# Patient Record
Sex: Male | Born: 1960 | Race: White | Hispanic: No | Smoking: Current every day smoker
Health system: Southern US, Community
[De-identification: ages and names within clinical notes are randomized; demographics above are authoritative.]

## PROBLEM LIST (undated history)

## (undated) DIAGNOSIS — F329 Major depressive disorder, single episode, unspecified: Secondary | ICD-10-CM

## (undated) DIAGNOSIS — F32A Depression, unspecified: Secondary | ICD-10-CM

## (undated) DIAGNOSIS — I10 Essential (primary) hypertension: Secondary | ICD-10-CM

## (undated) DIAGNOSIS — I639 Cerebral infarction, unspecified: Secondary | ICD-10-CM

## (undated) HISTORY — DX: Depression, unspecified: F32.A

## (undated) HISTORY — DX: Essential (primary) hypertension: I10

## (undated) HISTORY — PX: KNEE SURGERY: SHX244

## (undated) HISTORY — DX: Cerebral infarction, unspecified: I63.9

## (undated) HISTORY — DX: Major depressive disorder, single episode, unspecified: F32.9

---

## 1998-09-09 ENCOUNTER — Emergency Department (HOSPITAL_COMMUNITY): Admission: EM | Admit: 1998-09-09 | Discharge: 1998-09-09 | Payer: Self-pay | Admitting: Emergency Medicine

## 2000-01-22 ENCOUNTER — Emergency Department (HOSPITAL_COMMUNITY): Admission: EM | Admit: 2000-01-22 | Discharge: 2000-01-22 | Payer: Self-pay | Admitting: Emergency Medicine

## 2001-05-17 ENCOUNTER — Emergency Department (HOSPITAL_COMMUNITY): Admission: EM | Admit: 2001-05-17 | Discharge: 2001-05-17 | Payer: Self-pay | Admitting: Emergency Medicine

## 2001-05-27 ENCOUNTER — Encounter: Payer: Self-pay | Admitting: Emergency Medicine

## 2001-05-27 ENCOUNTER — Emergency Department (HOSPITAL_COMMUNITY): Admission: EM | Admit: 2001-05-27 | Discharge: 2001-05-27 | Payer: Self-pay | Admitting: Emergency Medicine

## 2001-05-28 ENCOUNTER — Emergency Department (HOSPITAL_COMMUNITY): Admission: EM | Admit: 2001-05-28 | Discharge: 2001-05-28 | Payer: Self-pay | Admitting: Emergency Medicine

## 2001-05-28 ENCOUNTER — Encounter: Payer: Self-pay | Admitting: Emergency Medicine

## 2001-08-09 ENCOUNTER — Emergency Department (HOSPITAL_COMMUNITY): Admission: EM | Admit: 2001-08-09 | Discharge: 2001-08-09 | Payer: Self-pay | Admitting: Emergency Medicine

## 2001-08-09 ENCOUNTER — Encounter: Payer: Self-pay | Admitting: Emergency Medicine

## 2003-09-17 ENCOUNTER — Emergency Department (HOSPITAL_COMMUNITY): Admission: EM | Admit: 2003-09-17 | Discharge: 2003-09-18 | Payer: Self-pay | Admitting: Emergency Medicine

## 2004-02-21 DIAGNOSIS — I639 Cerebral infarction, unspecified: Secondary | ICD-10-CM

## 2004-02-21 HISTORY — DX: Cerebral infarction, unspecified: I63.9

## 2004-04-09 ENCOUNTER — Emergency Department (HOSPITAL_COMMUNITY): Admission: EM | Admit: 2004-04-09 | Discharge: 2004-04-09 | Payer: Self-pay | Admitting: Emergency Medicine

## 2004-12-17 ENCOUNTER — Emergency Department (HOSPITAL_COMMUNITY): Admission: EM | Admit: 2004-12-17 | Discharge: 2004-12-17 | Payer: Self-pay | Admitting: Emergency Medicine

## 2005-02-16 ENCOUNTER — Inpatient Hospital Stay (HOSPITAL_COMMUNITY): Admission: EM | Admit: 2005-02-16 | Discharge: 2005-03-11 | Payer: Self-pay | Admitting: Emergency Medicine

## 2005-02-16 ENCOUNTER — Ambulatory Visit: Payer: Self-pay | Admitting: Physical Medicine & Rehabilitation

## 2005-02-17 ENCOUNTER — Encounter: Payer: Self-pay | Admitting: Cardiology

## 2005-02-17 ENCOUNTER — Ambulatory Visit: Payer: Self-pay | Admitting: Cardiology

## 2005-02-20 ENCOUNTER — Ambulatory Visit: Payer: Self-pay | Admitting: Critical Care Medicine

## 2005-03-11 ENCOUNTER — Inpatient Hospital Stay (HOSPITAL_COMMUNITY)
Admission: AD | Admit: 2005-03-11 | Discharge: 2005-04-12 | Payer: Self-pay | Admitting: Physical Medicine & Rehabilitation

## 2005-03-11 ENCOUNTER — Ambulatory Visit: Payer: Self-pay | Admitting: Physical Medicine & Rehabilitation

## 2005-04-11 ENCOUNTER — Ambulatory Visit: Payer: Self-pay | Admitting: Physical Medicine & Rehabilitation

## 2005-04-14 ENCOUNTER — Ambulatory Visit: Payer: Self-pay | Admitting: *Deleted

## 2005-04-14 ENCOUNTER — Ambulatory Visit: Payer: Self-pay | Admitting: Nurse Practitioner

## 2005-04-25 ENCOUNTER — Emergency Department (HOSPITAL_COMMUNITY): Admission: EM | Admit: 2005-04-25 | Discharge: 2005-04-25 | Payer: Self-pay | Admitting: Emergency Medicine

## 2005-05-04 ENCOUNTER — Ambulatory Visit (HOSPITAL_COMMUNITY)
Admission: RE | Admit: 2005-05-04 | Discharge: 2005-05-04 | Payer: Self-pay | Admitting: Physical Medicine & Rehabilitation

## 2005-05-08 ENCOUNTER — Ambulatory Visit: Payer: Self-pay | Admitting: Physical Medicine & Rehabilitation

## 2005-05-08 ENCOUNTER — Encounter
Admission: RE | Admit: 2005-05-08 | Discharge: 2005-08-06 | Payer: Self-pay | Admitting: Physical Medicine & Rehabilitation

## 2005-05-20 ENCOUNTER — Emergency Department (HOSPITAL_COMMUNITY): Admission: EM | Admit: 2005-05-20 | Discharge: 2005-05-21 | Payer: Self-pay | Admitting: Emergency Medicine

## 2005-05-24 ENCOUNTER — Encounter
Admission: RE | Admit: 2005-05-24 | Discharge: 2005-08-09 | Payer: Self-pay | Admitting: Physical Medicine and Rehabilitation

## 2005-06-01 ENCOUNTER — Ambulatory Visit: Payer: Self-pay | Admitting: Psychology

## 2005-07-04 ENCOUNTER — Emergency Department (HOSPITAL_COMMUNITY): Admission: EM | Admit: 2005-07-04 | Discharge: 2005-07-05 | Payer: Self-pay | Admitting: Emergency Medicine

## 2005-08-07 ENCOUNTER — Ambulatory Visit: Payer: Self-pay | Admitting: Physical Medicine & Rehabilitation

## 2005-08-07 ENCOUNTER — Encounter
Admission: RE | Admit: 2005-08-07 | Discharge: 2005-11-05 | Payer: Self-pay | Admitting: Physical Medicine & Rehabilitation

## 2005-09-05 ENCOUNTER — Encounter: Admission: RE | Admit: 2005-09-05 | Discharge: 2005-09-05 | Payer: Self-pay | Admitting: Ophthalmology

## 2005-10-02 ENCOUNTER — Ambulatory Visit (HOSPITAL_BASED_OUTPATIENT_CLINIC_OR_DEPARTMENT_OTHER): Admission: RE | Admit: 2005-10-02 | Discharge: 2005-10-02 | Payer: Self-pay | Admitting: Ophthalmology

## 2005-11-08 ENCOUNTER — Ambulatory Visit (HOSPITAL_BASED_OUTPATIENT_CLINIC_OR_DEPARTMENT_OTHER): Admission: RE | Admit: 2005-11-08 | Discharge: 2005-11-08 | Payer: Self-pay | Admitting: Ophthalmology

## 2005-12-28 ENCOUNTER — Emergency Department (HOSPITAL_COMMUNITY): Admission: EM | Admit: 2005-12-28 | Discharge: 2005-12-28 | Payer: Self-pay | Admitting: Emergency Medicine

## 2006-03-06 ENCOUNTER — Emergency Department (HOSPITAL_COMMUNITY): Admission: EM | Admit: 2006-03-06 | Discharge: 2006-03-06 | Payer: Self-pay | Admitting: Emergency Medicine

## 2006-07-03 ENCOUNTER — Emergency Department (HOSPITAL_COMMUNITY): Admission: EM | Admit: 2006-07-03 | Discharge: 2006-07-03 | Payer: Self-pay | Admitting: Emergency Medicine

## 2007-04-11 IMAGING — CR DG CHEST 2V
2 series · 2 of 2 positions shown · non-contrast
Comparison: 02/17/2005

CLINICAL DATA: CVA

CHEST - 2 VIEW:

[view not recorded (1 of 2)]
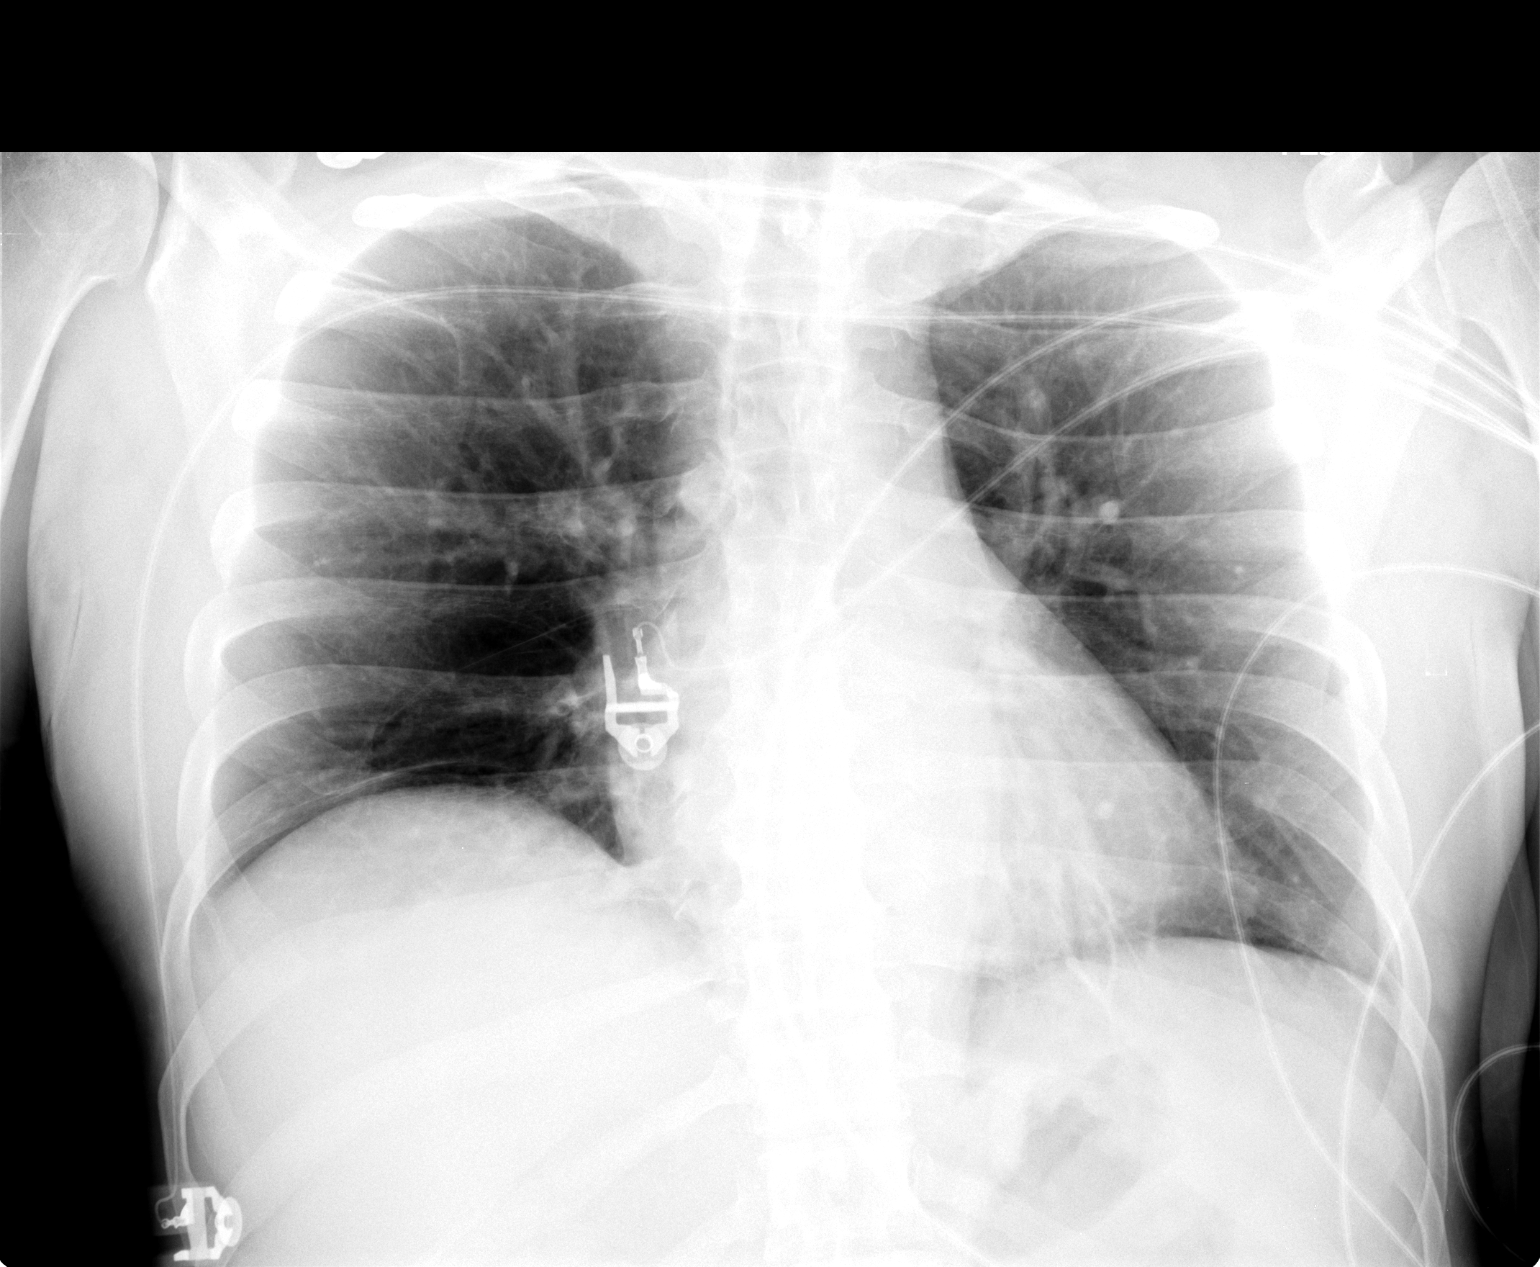

[view not recorded (2 of 2)]
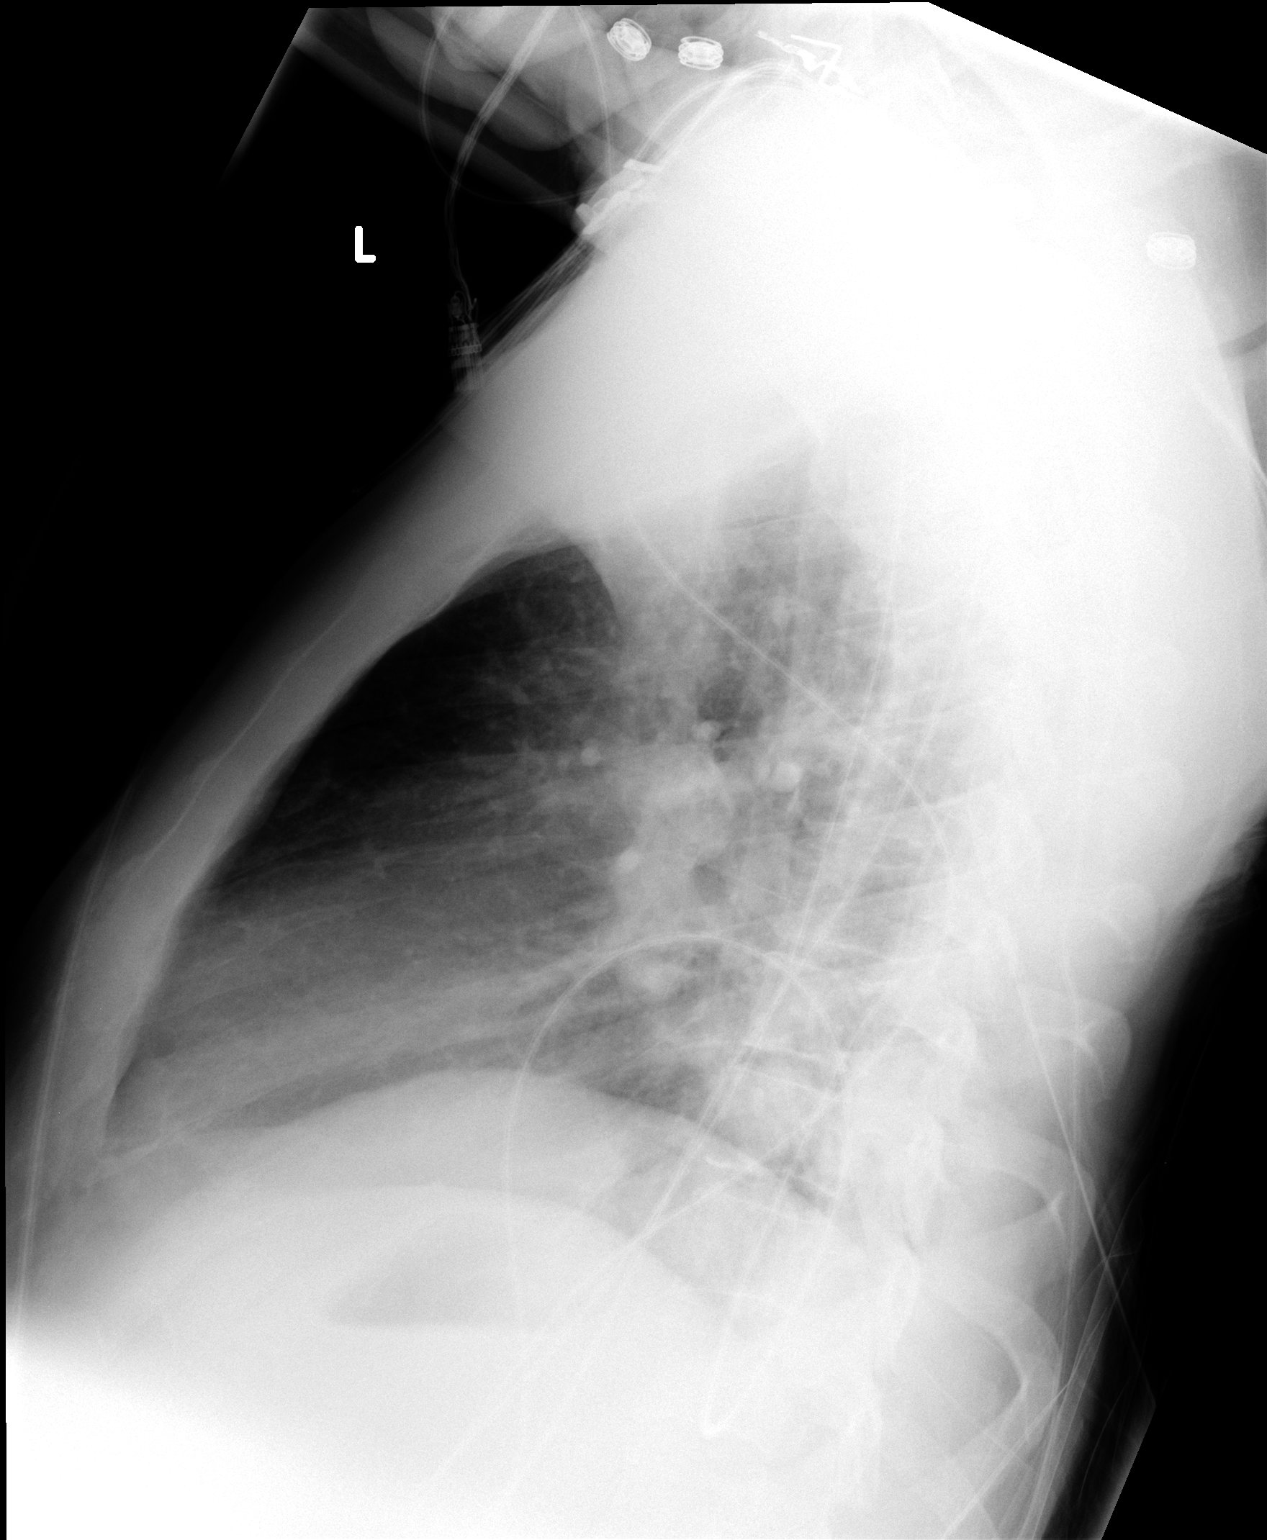

[2 of 2 positions shown; findings below may reference images not displayed]

FINDINGS: Low lung volumes. Minimal bibasilar atelectasis. No effusions. Heart
is normal size.
IMPRESSION: Low volumes. Minimal bibasilar atelectasis.

## 2007-04-12 IMAGING — CR DG CHEST 1V PORT
1 series · 1 of 1 positions shown · non-contrast
Comparison: none

CLINICAL DATA: Short of breath

Portable chest at 0647:
Comparison 02/17/2005. New airspace opacity at the right lung base. Left lung
remains clear. Heart size upper normal. No effusion.

[view not recorded]
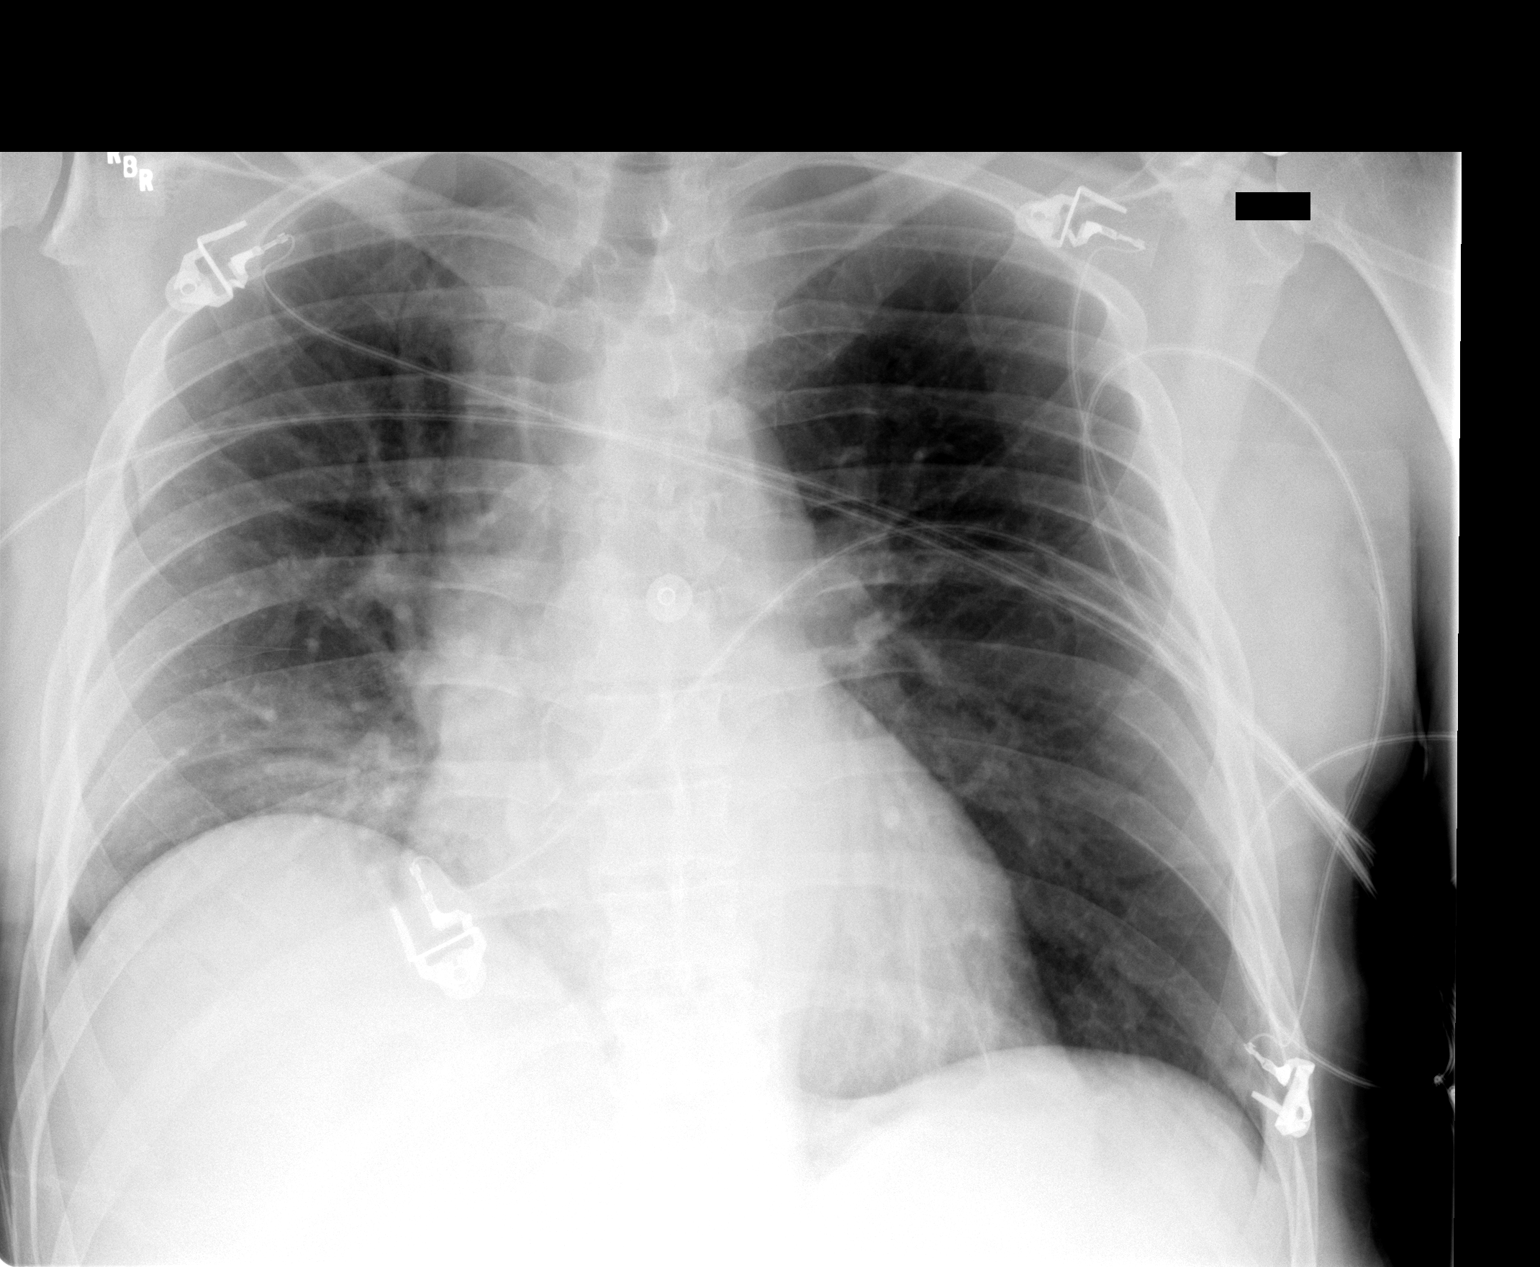

[1 of 1 positions shown; findings below may reference images not displayed]

IMPRESSION: 1. New right lower lobe focal infiltrate

## 2007-04-14 IMAGING — CT CT HEAD W/O CM
1 of 2 series · 13 of 30 positions shown, 17 images · IV contrast (agent unspecified)
Comparison: 02/19/05.

CLINICAL DATA: Patient is status post CVA.  New ventriculostomy shunt catheter. 
HEAD CT WITHOUT CONTRAST:
TECHNIQUE: Contiguous axial images were obtained from the base of the skull through the vertex according to standard protocol without contrast.

[Series 2: brain · axial · 0.47mm/px · z∈[+143,+273]mm · 13 of 32 slices shown, 17 images]
[im 3/32  brain]
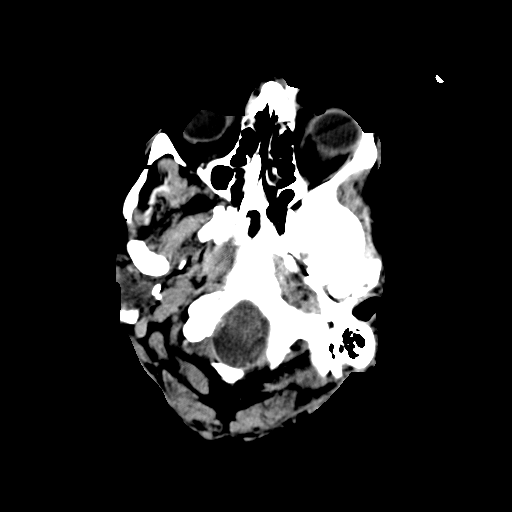
[im 3/32  bone]
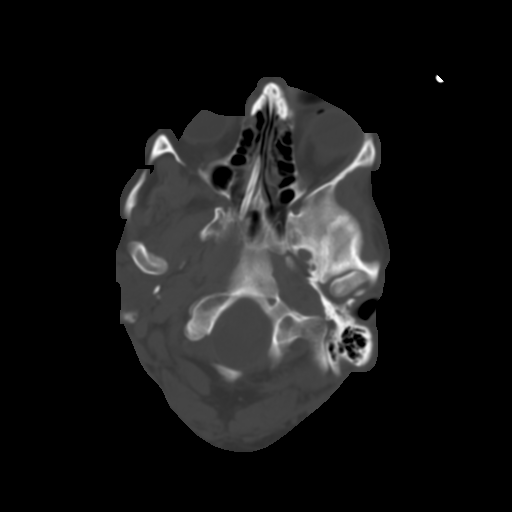
[im 5/32  brain]
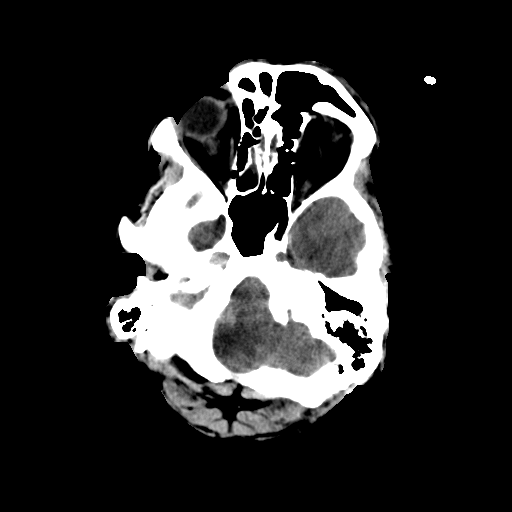
[im 7/32  brain]
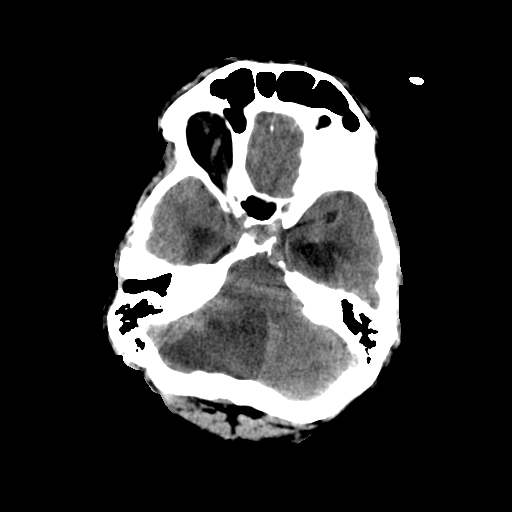
[im 9/32  brain]
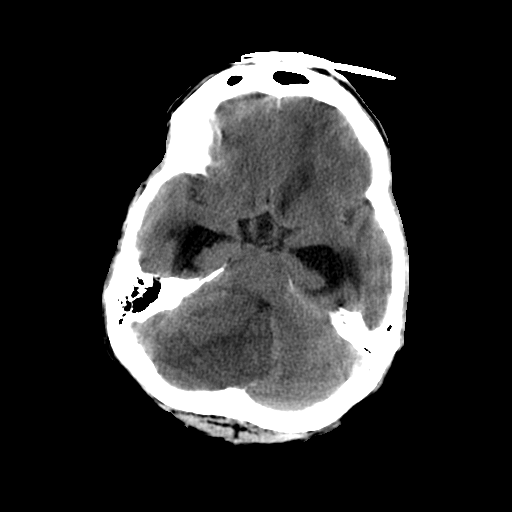
[im 12/32  brain]
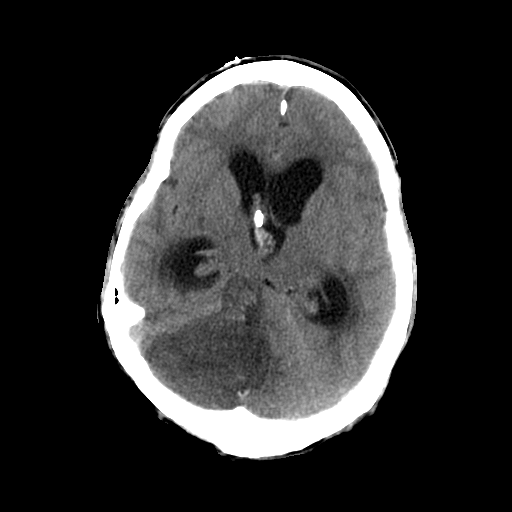
[im 12/32  bone]
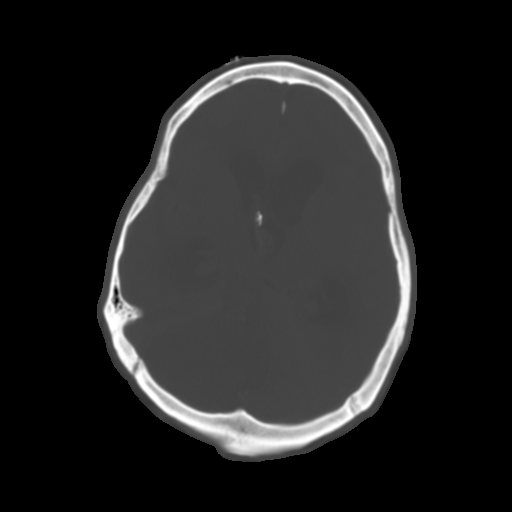
[im 14/32  brain]
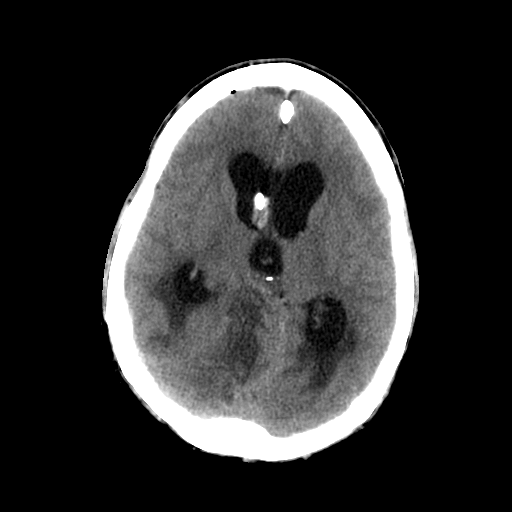
[im 16/32  brain]
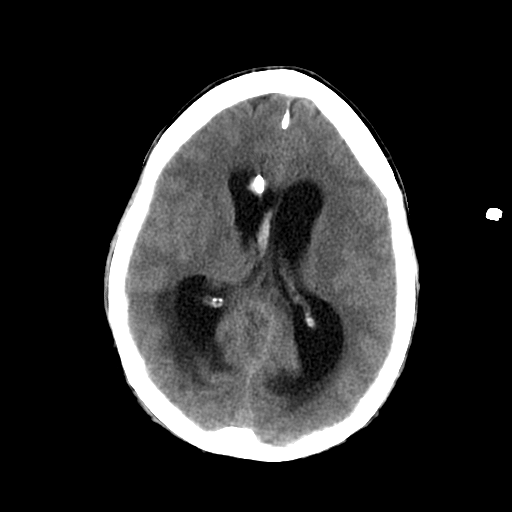
[im 18/32  brain]
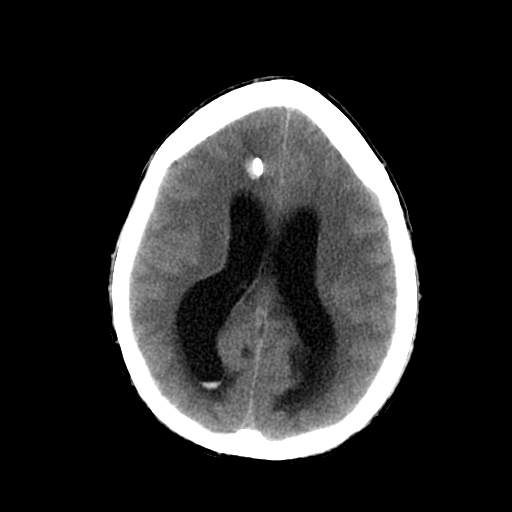
[im 20/32  brain]
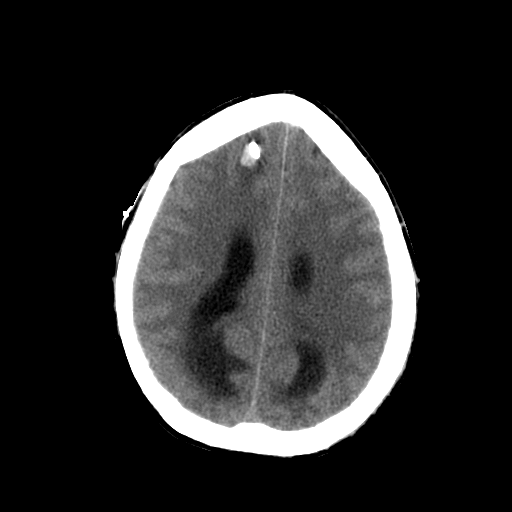
[im 20/32  bone]
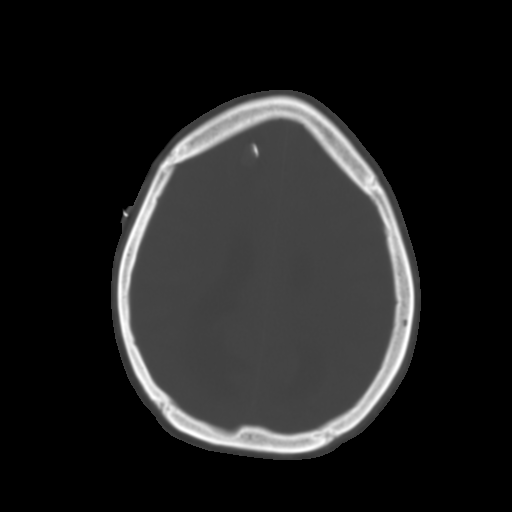
[im 23/32  brain]
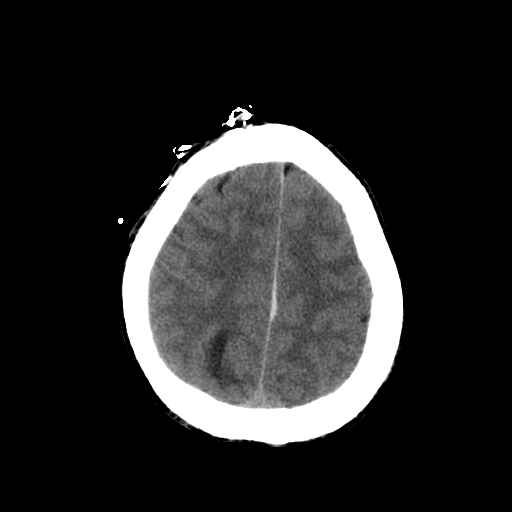
[im 25/32  brain]
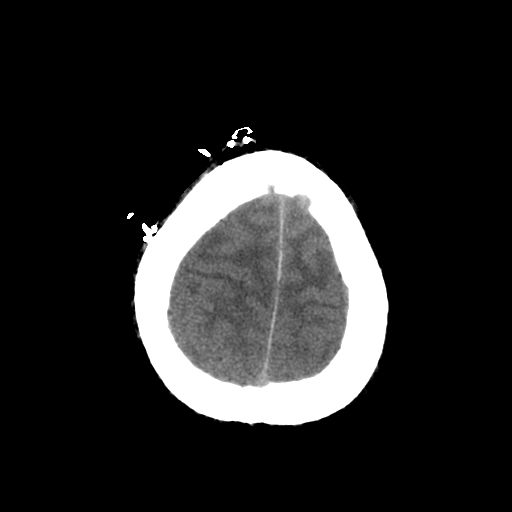
[im 27/32  brain]
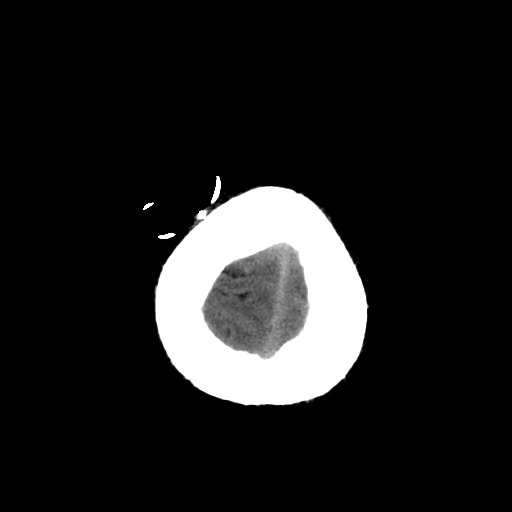
[im 29/32  brain]
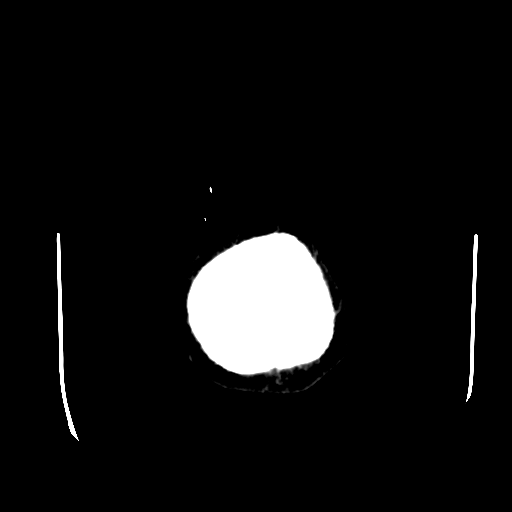
[im 29/32  bone]
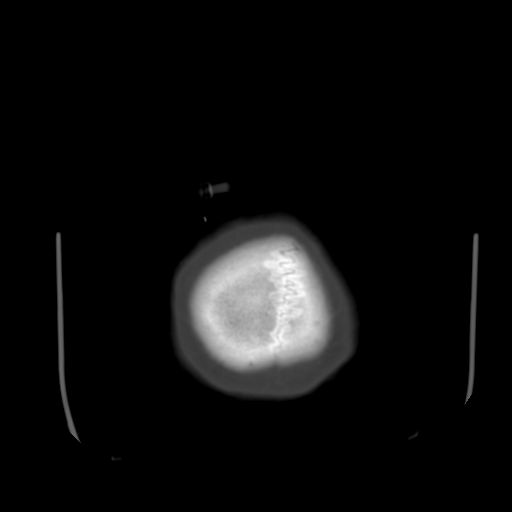

[13 of 30 positions shown; findings below may reference images not displayed]

FINDINGS: Again seen is a right frontal approach ventriculostomy shunt catheter.  There is now some high attenuation debris about the catheter tip with the tip of the shunt in the third ventricle.  There has been marked interval progression in hydrocephalus.  Small amount of blood is seen layering in the posterior horn of the right lateral ventricle.  Hypoattenuation in the right cerebellar hemisphere with mass effect compatible with stroke as described on prior studies is again seen.  Basilar cisterns remain open.
IMPRESSION: 1.  Marked interval increase in hydrocephalus since study yesterday.  Ventriculostomy shunt catheter now has high attenuation debris around it and is likely clogged.  Findings were called to Dr. Junia of [REDACTED] immediately on interpretation at [DATE] a.m. on 02/20/05. 
2.  Right cerebellar infarct again noted. 
3.  Small amount of intraventricular blood again seen.

## 2007-04-14 IMAGING — CR DG CHEST 1V PORT
1 series · 1 of 1 positions shown · non-contrast
Comparison: 02/19/05.

CLINICAL DATA: 44-year-old, with CVA.  Patient on ventilator.  
 PORTABLE CHEST:

[view not recorded]
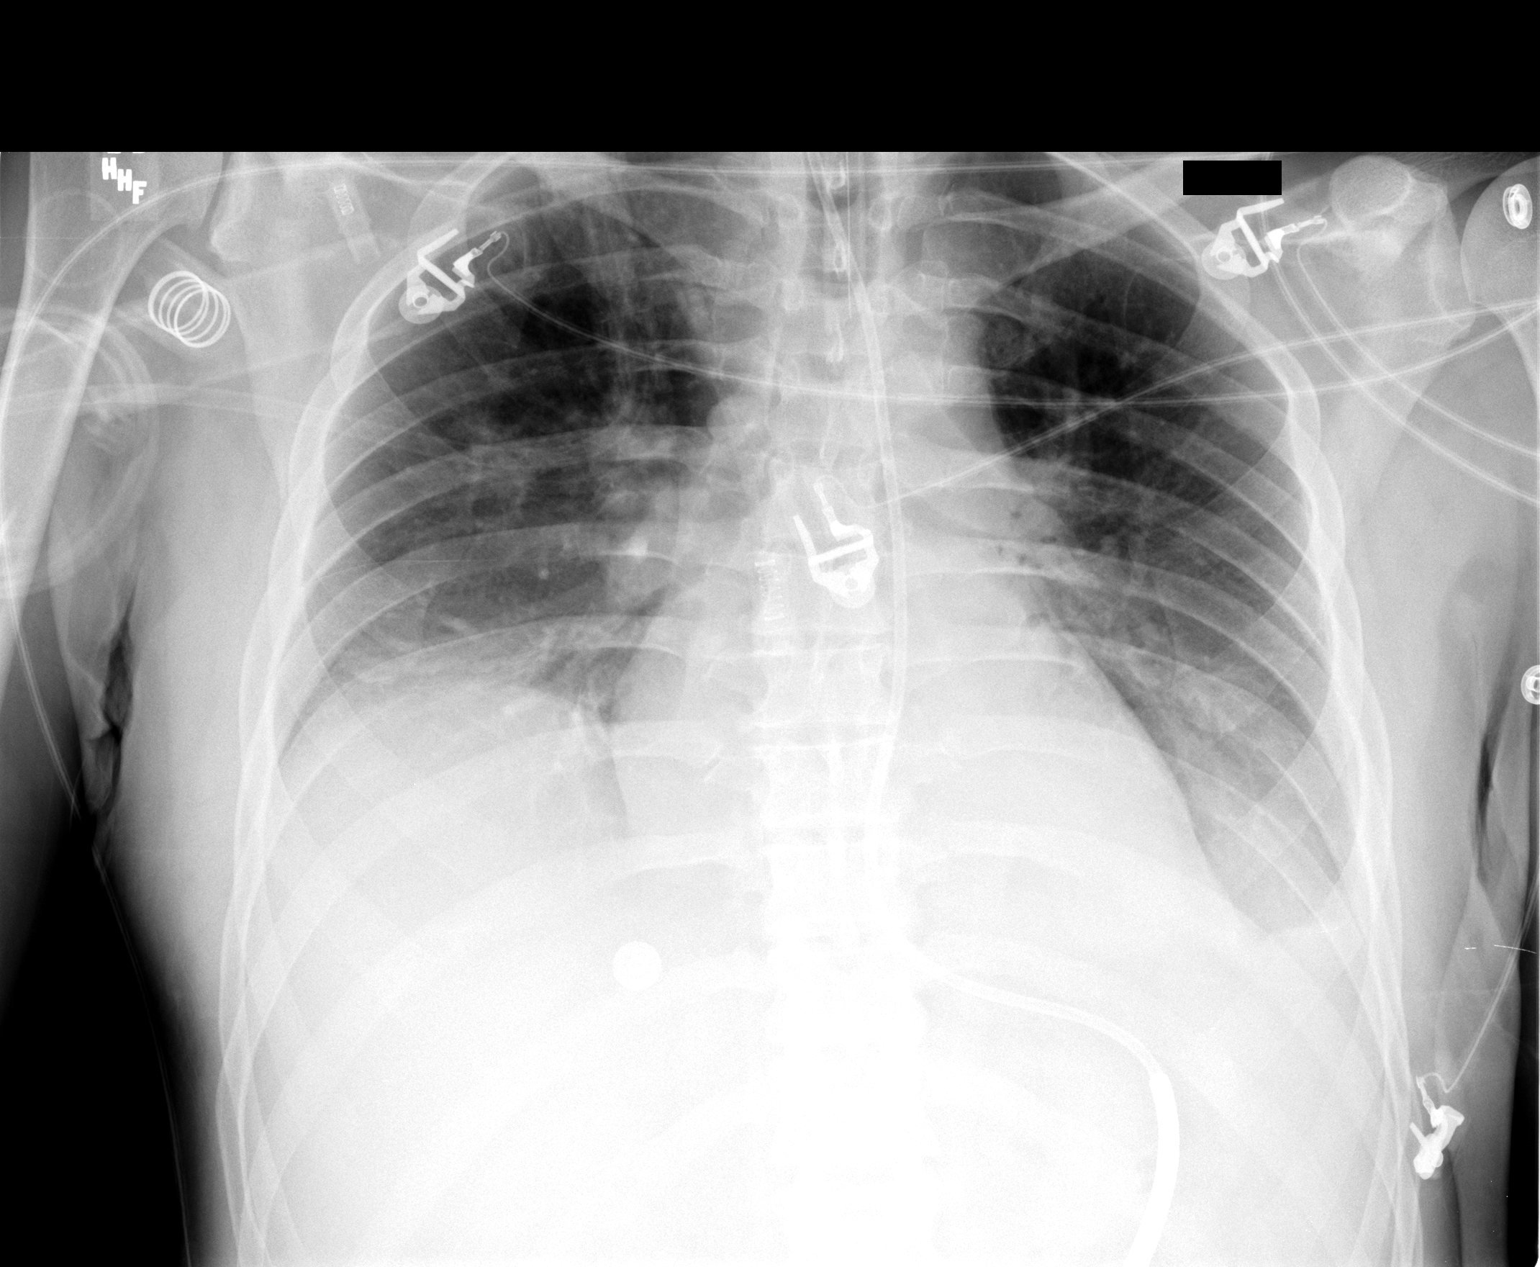

[1 of 1 positions shown; findings below may reference images not displayed]

FINDINGS: The endotracheal tube is in stable position at the midtrachea level.  There has been interval placement of a Panda tube which is in the stomach.  Heart and lungs have not significantly changed.  There are bilateral effusions and areas of bilateral atelectasis.
IMPRESSION: 1.  Interval placement of a Panda tube which is in the stomach. 
 2.  Persistent effusions, atelectasis, and possible mild edema.

## 2008-02-21 DIAGNOSIS — I10 Essential (primary) hypertension: Secondary | ICD-10-CM

## 2008-02-21 HISTORY — DX: Essential (primary) hypertension: I10

## 2010-01-18 ENCOUNTER — Emergency Department (HOSPITAL_COMMUNITY): Admission: EM | Admit: 2010-01-18 | Discharge: 2010-01-18 | Payer: Self-pay | Admitting: Emergency Medicine

## 2010-03-13 ENCOUNTER — Encounter: Payer: Self-pay | Admitting: Physical Medicine & Rehabilitation

## 2010-03-15 ENCOUNTER — Emergency Department (HOSPITAL_COMMUNITY)
Admission: EM | Admit: 2010-03-15 | Discharge: 2010-03-15 | Payer: Self-pay | Source: Home / Self Care | Admitting: Emergency Medicine

## 2010-07-01 ENCOUNTER — Other Ambulatory Visit: Payer: Self-pay | Admitting: Family Medicine

## 2010-07-01 ENCOUNTER — Ambulatory Visit
Admission: RE | Admit: 2010-07-01 | Discharge: 2010-07-01 | Disposition: A | Discharge status: Home / Self Care | Payer: Medicare Other | Source: Ambulatory Visit | Attending: Family Medicine | Admitting: Family Medicine

## 2010-07-01 DIAGNOSIS — R52 Pain, unspecified: Secondary | ICD-10-CM

## 2010-07-01 DIAGNOSIS — R609 Edema, unspecified: Secondary | ICD-10-CM

## 2010-07-08 NOTE — H&P (Signed)
NAME:  DANIELA, HERNAN NO.:  [id number]   MEDICAL RECORD NO.:  [id number]          PATIENT TYPE:  IPS   LOCATION:  4032                         FACILITY:  [facility]   PHYSICIAN:  Erick Colace, M.D.DATE OF BIRTH:  03-17-1960   DATE OF ADMISSION:  03/11/2005  DATE OF DISCHARGE:                                HISTORY & PHYSICAL   REASON FOR ADMISSION:  Quadriparesis, cranial nerve deficits, and cognitive  deficits related to large brainstem CVA.   HISTORY:  A 50 year old male with history of hepatitis C and IV drug abuse  admitted on February 16, 2005, with dysarthria, right-sided weakness, and  ataxia secondary to large brainstem stroke. Carotid Doppler showed a right  40% to 60% ICA stenosis. cerebral angiogram with temporary balloon occlusion  of abdominal aorta by Dr. Corliss Skains. The patient presented with leukocytosis  on admission with a white count of 24,000, elevated blood sugar to 241. On  February 18, 2005, noted to have decreased mental status. Head CT revealed  mass effect on the 4th ventricle and pons because of hydrocephalus requiring  IVC per Dr. Phoebe Perch. The patient developed a probable aspiration pneumonia,  had to be intubated, and started on IV Zosyn. He was trached and PEG placed  per Dr. Janee Morn on February 27, 2005, due to poor pulmonary toilet and severe  dysphagia. He has had fevers and grew out strep pneumonia, sensitive to  vancomycin and was changed to vancomycin and Zosyn on January 16th. Follow-  up CT of head on January 12th showed a new hemorrhagic inversion of the  right cerebellar infarct, stable right frontal hematoma. He has been  afebrile now since January 17th.   He has been downsized to #4 trach. He still has secretions, but he has good  cough. He has had problems with agitation requiring four point restraints  and mittens due to pulling at tube and PEG. He moves all four extremities.   REVIEW OF SYSTEMS:  Cannot be obtained  secondary to difficulty with  localizing his trach, but does follow commands.   PAST MEDICAL HISTORY:  As noted above with hepatitis C and polysubstance  abuse, history unknown.   SOCIAL HISTORY:  Lives with mother and independent prior to admission.  Smokes one and a half packs per day and uses heroin.   FUNCTIONAL HISTORY:  Independent prior to admission.   ALLERGIES:  None known.   Last white count of 6.6, hemoglobin 14.4, BUN 15, creatinine 0.6. Hepatitis  C and D positive.   PHYSICAL EXAMINATION:  GENERAL: In no acute distress. He has obvious cranial  nerve deficits, i.e., decreased right 3rd cranial nerve. He has no evidence  of ptosis. He attempts to phonate in whispers, but noncomprehensible. His  trach site is intact.  LUNGS: Occasional wheeze on the left, some upper airway sounds, upper airway  rhonchi.  HEART: Regular rate and rhythm.  ABDOMEN: Soft and nontender. PEG site is clean.  NEUROLOGIC: Memory and mood cannot be assessed. Sensation cannot be  assessed. Motor strength has 4/5 strength in bilateral deltoids by stress of  grip as well as hip flexion,  knee extension, ankle dorsal flexion.   IMPRESSION:  1.  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 Need to monitor for signs of neurologic      decline which would signal current hydrocephalus. Neurology recommends      aspirin in two weeks for further prophylaxis.  2.  Agitation. Will need Seroquel q.h.s. and add trazodone for sleep and      monitor sleep/wake cycle. Restraints as needed to protect against self-      discontinuation of trach and PEG.  3.  Dyslipidemia, on Zocor.  4.  Hypertension. Continue Catapres b.i.d.   The patient is an appropriate rehabilitation candidate. Should make good  progression with mobility. Prognosis for dysphagia is guarded. May wean  trach during rehab stay.   Estimated length of stay is two to three  weeks.      Erick Colace, M.D.  Electronically Signed     AEK/MEDQ  D:  03/11/2005  T:  03/12/2005  Job:  [id number]   cc:   Pramod P. Pearlean Brownie, MD  Fax: [phone]

## 2010-07-08 NOTE — Assessment & Plan Note (Signed)
HISTORY OF PRESENT ILLNESS:  Alan Patel is back regarding his right  posterior inferior cerebral artery stroke with hydrocephalus and hemorrhagic  transformation.  The patient had significant vertigo, nausea, ataxia and  dysphagia.  He was discharged from rehab on April 12, 2005, under the  care of his family.  The patient has done fairly well, receiving Home Health  therapy at this point.  He is having some pain, occasionally in the right  leg.  He is sleeping fairly well.  He has good appetite.  Although, he does  notice decrease in his taste.  He does report a depression and thoughts of  despair.  He felt that he was better off dead at times due to the fact that  he thinks he is a burden on his family.  The patient does report a double  vision.  He has worn his patch periodically.  We had him on a scopolamine  patch for his vertigo symptoms.  He had stopped this over concern that it  was causing his decreased taste, but he notes no changes in taste after  coming off this.  He did notice increase in his vertigo symptoms.   The patient seems to have been getting along fairly well with his family.  No worse in psychosocial issues prior to arrival.  His girlfriend is very  involved in his care.   REVIEW OF SYSTEMS:  The patient reports some numbness in the left shoulder.  Weight loss overall is noted, as well as, some night sweats, occasional  shortness of breath.  Other pertinent positive are listed above.   SOCIAL HISTORY:  Pertinent positive are listed above.  The patient has not  returned to smoking.  His girlfriend does smoke.   PHYSICAL EXAMINATION:  VITAL SIGNS:  Blood pressure 135/84, pulse 90,  respirations 16, saturating 96% on room air.  GENERAL APPEARANCE:  The patient is pleasant, no acute distress.  He is  alert and oriented x3.  Affect is bright and appropriate.  NEUROLOGICAL:  Gait is stable.  Coordination is decreased with significant  ataxia in the right upper  and lower extremities today.  Sensory function is  fair.  He does have some right sided oral numbness and palatal numbness.  The left shoulder is inconsistently numb.  Reflexes are increased on the  right side today.  The patient is dysarthric.  He has good cognition.  He  does have difficulty to abduction of the right eye as he is double when  using both eyes to view, particularly with scanning to the right.  Skin was  generally intact.  Mood, for the most part, was appropriate, although, he  became tearful when talking about his family and his own personal situation.  Dentition was poor with multiple dental caries noted in the mouth today.  HEART:  Regular rate and rhythm.  LUNGS:  Clear.  ABDOMEN:  Soft, nontender.   ASSESSMENT:  1.  Large right pica stroke with diplopia, right sided ataxia, dysphagia and      vertigo.  The patient has made nice strides overall.  2.  History of hypertension.  3.  Hepatitis C positive.   PLAN:  1.  Will initiate outpatient PT/OT speech as well as psychological      counseling.  2.  Will initiate a trial of Lexapro 10 mg q.h.s. for mood.  3.  Encouraged use of eye patch to help with vision as well as work on  strengthening the right extraocular musculature.  4.  Encouraged use of scopolamine patch to assist in vertigo symptoms.  5.  For the most part, the patient has done quite well.  I will see him back      in about two months time for follow up.  I asked him to call me back      sooner if need be.      Ranelle Oyster, M.D.  Electronically Signed     ZTS/MedQ  D:  05/10/2005 11:51:48  T:  05/11/2005 09:30:19  Job #:  [id number]

## 2010-07-08 NOTE — Op Note (Signed)
NAME:  Alan Patel, Alan Patel            ACCOUNT NO.:  [id number]   MEDICAL RECORD NO.:  [id number]          PATIENT TYPE:  AMB   LOCATION:  [facility]                         FACILITY:  [facility]   PHYSICIAN:  Tyrone Apple. Karleen Hampshire, M.D.DATE OF BIRTH:  [birth year]   DATE OF PROCEDURE:  11/08/2005  DATE OF DISCHARGE:                                 OPERATIVE REPORT   PREOPERATIVE DIAGNOSES:  1. Left fourth nerve paresis.  2. Diplopia.  3. Status post cerebrovascular accident.   PROCEDURE:  Left inferior oblique myectomy of 10 mm.   SURGEON:  Tyrone Apple. Karleen Hampshire, M.D.   ANESTHESIA:  General with laryngeal mask airway.   POSTOPERATIVE DIAGNOSES:  1. Status post left fourth nerve paresis.  2. Status post left inferior oblique myectomy.   INDICATION FOR PROCEDURE:  [name] is a 50 year old male with  diplopia secondary to a left fourth nerve paresis, which occurred 9 months  prior concurrently with a stroke and did not resolve post recovery from the  CVA.  The patient has residual diplopia secondary to a left hypertropia of 6  prism diopters.  This procedure is indicated to restore single binocular  vision and to restore alignment of the visual axis and ablate the diplopia.  The risks and benefits of the procedure were explained to the patient prior  to the procedure and informed consent was obtained.   DESCRIPTION OF TECHNIQUE:  The patient was taken into the operating room and  placed in a supine position.  The entire face was prepped and draped in the  usual sterile manner.  My attention was adressed to the left eye.  Forced  duction tests were performed and found to be negative.  The globe was then  held in the inferior temporal quadrant and the eye was elevated and  adducted.  An incision was then made in the inferior temporal fornix, taken  down to the posterior sub-Tenon's space and the left lateral rectus tendon  was then isolated on a Stevens hook, subsequently on a Green hook.  A  second  Green hook was then passed beneath the tendon.  This was used to hold the  globe in an elevated and adducted position.  Next the inferior oblique was  then isolated coursing from its origin in the anterior floor of the orbit to  its insertion in the posterior inferior temporal quadrant of the globe.  Next the its overlying muscle fascia and intermuscular septae were cut, the  tendon was then placed on two Green hooks and a 10  mm myectomy was performed.  Hemostasis was achieved with thermal cautery.  The conjunctiva was then repositioned.  There were no apparent  complications.  At the conclusion of the procedure TobraDex ointment was  instilled in the inferior fornices of the left eye.  There were no apparent  complications.      Casimiro Needle A. Karleen Hampshire, M.D.  Electronically Signed     MAS/MEDQ  D:  11/08/2005  T:  11/09/2005  Job:  [id number]

## 2010-07-08 NOTE — Discharge Summary (Signed)
NAME:  Alan Patel, Alan Patel   MEDICAL RECORD NO.:  [id number]          PATIENT TYPE:  IPS   LOCATION:  4025                         FACILITY:  [facility]   PHYSICIAN:  Ranelle Oyster, M.D.DATE OF BIRTH:  16-May-1960   DATE OF ADMISSION:  03/11/2005  DATE OF DISCHARGE:  04/12/2005                                 DISCHARGE SUMMARY   DISCHARGE DIAGNOSES:  1.  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  2.  Hypertension.  3.  Hepatitis C positive.  4.  Abnormal liver function tests, almost resolved.   HISTORY OF PRESENT ILLNESS:  Alan Patel is a 50 year old male with a  history of hepatitis C and IV drug use admitted to [facility] February 16, 2005, with dysarthria, right-sided weakness and ataxia  secondary to large brain stem stroke.  Full work-up done included check of  carotid Dopplers that showed right 40 to 60% ICA stenosis, cerebral  __________ balloon occlusion of abdominal aorta done by Dr. Corliss Skains.  Patient noted to have leukocytosis on admission with white count of 24,000  and elevated blood sugar 241.  On February 18, 2005, patient with decrease  in mental status secondary to mass effect fourth ventricle and pons with  hydrocephalus requiring IVC placement by Dr. Phoebe Perch as patient with  increased respiratory distress and chest x-ray with question aspiration  pneumonia.  He was intubated and started on IV Zosyn.  Patient was difficult  extubation with periods of apnea requiring trach and PEG placement on  February 27, 2005, by Dr. Janee Morn.  Patient has had issues with blood  cultures positive for Strep pneumoniae and has been treated with multiple  antibiotics.  Most recent CT of head of March 03, 2005, shows new  hemorrhagic conversion right cerebellar infarct, stable right frontal  hematoma, intraventricular hemorrhage and hydrocephalus.  Patient  currently  has had trach downsized to #4 cuff.  He does continue with agitation  requiring restraints as continues to pull on IV as well as PEG tube and  trach.  He is currently moving all four extremities, noted to have poor  balance and unsteady gait as well as decrease in left lower extremity motor  control.  He is noted to have impairment in ADLs and mobility as well as  cognitive deficits secondary to brain stem stroke.  Rehab consulted for  further therapies.   PAST MEDICAL HISTORY:  1.  Hepatitis C.  2.  Polysubstance abuse.   ALLERGIES:  NO KNOWN DRUG ALLERGIES.   FAMILY HISTORY:  Unknown.   SOCIAL HISTORY:  Patient lives with mother.  Was independent prior to  admission.  He does not use any alcohol.  Smokes one and a half pack per day  and positive heroin use.   HOSPITAL COURSE:  Alan Patel was admitted to rehab on March 11, 2005, for inpatient therapies to consist of PT and OT daily.  Past  admission, patient's blood pressures were monitored on b.i.d. basis and  Catapres.  He was maintained on trach  with a trach collar and continues tube  feeds initially.  Secondary to patient having tendency to pull out trach and  recent removal of PEG, restraints were used secondary to safety concerns  initially.  Patient's trach was changed to cuffless #4 and he was started on  Passy Muir valve trials and he was able to tolerate this without difficulty.   Labs were done past admission revealing hemoglobin 13.7, hematocrit 39.1,  white count 5.8, platelets 334.  Sodium 141, potassium 3.7, chloride 103,  CO2 30, BUN 12, creatinine 0.7, glucose 139.  LFTs show some elevation with  AST at 92, ALT 212, alkaline phosphatase 99.  Repeat LFTs were done and  initially showed elevation on March 15, 2005, with AST 121, ALT 272,  alkaline phosphatase 70, total bilirubin 0.5.  Patient's Zocor was placed on  hold and his LFTs have been monitored along.  Acute hepatitis C panel was   done and this showed anti-HCV positive.  Hepatitis B surface antigen,  hepatitis B core antigen were negative.  Anti-HIV IgM negative.  Last check  of LFTs of April 06, 2005, shows much improvement with AST 35, ALT 131,  alkaline phosphatase 113, total bilirubin 0.8.  Patient's Zocor was resumed  and he continues on this at time of discharge.  Patient's blood pressures  have been monitored on b.i.d. basis.  Catapres has slowly been tapered off  and blood pressure is noted to be on low side.  AT time of discharge,  patient's blood pressures ranging from high 90s to 120s systolic, 60s to 72Z  diastolic.  Heart rate control from 60s to 80s range.   Initially patient was noted to require frequent suctioning secondary to  increase in oral secretions.  Once [employer] valve trial was started, he  was started on continuous Passy Muir occlusion on March 17, 2005.  He was  able to tolerate this throughout the day and also through the night.  He was  decannulated on March 20, 2005, without difficulty.  As patient's mobility  improved, Foley discontinued on March 21, 2005, and patient was noted to  be voiding without signs of retention.  Follow-up swallow evaluation  initially of 130 showed patient to continue with severe dysphagia and he was  left NPO with bolus tube feeds.  Repeat MBS of March 29, 2005, showed  mild pharyngeal dysphagia with decrease in epiglottic deflection and  penetration obtained.  Regular thin liquid trials were initiated with speech  therapy only to practice use of chin tuck and aspiration precautions.  As  patient was able to do this consistently.  Repeat swallow done on April 10, 2005, and patient was advanced to a regular diet with thin liquids.  He  does continue to have delayed pharyngeal swallow reflex.  He is able to use  a chin tuck to eliminate penetration.  Patient continues to require using chin tuck for now.  He will have follow-up MBS on outpatient  basis to  further advance him to regular diet without compensatory techniques.   Patient's agitation is much improved during his stay.  He currently  continues with significant visual deficits, although he has made great  improvements throughout.  He continues to have mild nystagmus in left and  right eye.  He was noted to have increased dizziness with mobility  initially.  Scopolamine patch was added on April 06, 2005, and this has  helped greatly with his symptomatology.  Patient continues with increased  ataxia right  upper and right lower extremity currently.  In terms of self-  care, he is currently requiring setup assist for upper body care and minimal  assist for lower body care, supervision for toileting, minimal assist for  clothes manipulation.  Patient is at supervision for squat-pivot transfers,  close supervision to minimal assist sit to stand transfers.  He currently  requires minimal assist ambulating 150 feet with a rolling walker and  requires cueing to focus upon balance with gait as he has difficulty  controlling right lower extremity with occasional scissoring at times,  especially he requires minimal assist to navigate 12 stairs.  He requires  minimal assist for standing balance with upper extremity support.  Currently  patient's basic high level comprehension is intact.  He is able to follow  multistep commands without difficulty.  Basic high level expression is  intact.  Speech is intelligible at word level at 100%, 90% intelligible at  conversation level, mild imprecision of speech noted without signs of  apraxia.  Patient continues to require supervision minimal assist currently  secondary to balance safety issues.  SNF was recommended to patient and  family.  Initially, patient agreed, however, he declined this later.  Currently patient is discharged to home with girlfriend to provide  supervision to minimal assist past discharge.   DISCHARGE MEDICATIONS:  1.   Folic acid 1 mg per day.  2.  Prilosec OTC one per day.  3.  Zocor 20 mg nightly.  4.  Scopolamine patch 1.5 mcg per 72 hours change every three days.  5.  Colace 200 mg p.o. nightly.   ACTIVITY:  24-hour supervision and assistance.   DIET:  Regular.  Chin tuck with all liquids.   SPECIAL INSTRUCTIONS:  No alcohol, no drugs, no tobacco, no driving.   FOLLOW UP:  Patient to follow up with Dr. Ranelle Oyster, M.D., on May 20, 2005, at 11 a.m., to follow up with Dr. Pearlean Brownie in a couple of months, to  follow up with LMD at [facility] to set up for routine medical follow-up.      Greg Cutter, P.A.      Ranelle Oyster, M.D.  Electronically Signed    PP/MEDQ  D:  04/13/2005  T:  04/14/2005  Job:  [id number]   cc:   Pramod P. Pearlean Brownie, MD  Fax: [phone]

## 2010-07-08 NOTE — H&P (Signed)
NAME:  Alan Patel, Alan Patel            ACCOUNT NO.:  [id number]   MEDICAL RECORD NO.:  [id number]          PATIENT TYPE:  INP   LOCATION:  3107/07/18                         FACILITY:  [facility]   PHYSICIAN:  Mobolaji B. Bakare, M.D.DATE OF BIRTH:  1960/12/19   DATE OF ADMISSION:  02/16/2005  DATE OF DISCHARGE:                                HISTORY & PHYSICAL   PRIMARY CARE PHYSICIAN:  Unassigned.   CHIEF COMPLAINT:  Slurred speech about 6 p.m. yesterday.   HISTORY OF PRESENTING COMPLAINT:  Alan Patel is a 50 year old heroin IV  drug abuser.  He went to sleep about 3:30 yesterday afternoon, and he woke  up approximately 6 p.m.  He was noted by his mother to have slurred speech  and right facial droop.  He was unable to walk, he was staggering, and there  was no grip on his right side.  He was brought to the emergency department  by ambulance.  Code stroke was called and the patient arrived about 1917  hours.  The patient was not a candidate for tPA.  However, he underwent the  clinical trial involving temporary balloon occlusion of the abdominal aorta  of the abdomen.  He has been seen by Dr. Pearlean Brownie and procedure was done by Dr.  Bedelia Person.  He is currently in the intensive care unit, still has slurred  speech but denies any fine, localized weakness.  He noted headaches and  staggering  after he woke up.  He had some blurred vision in both eyes.   He was noted on evaluation in the emergency room to have leukocytosis of  24,500. The patient denies any fever or chills. He does have cough which is  productive of whitish sputum.  No pleuritic chest pain. He has no dysuria.  There is no diarrhea or abdominal pain.  He vomited once while in the ICU.  He was also noted to have hyperglycemia which is new for him. Blood glucose  was 241.  He has glucose in his urine.   The patient describes weight loss of about approximately 20 pounds in the  last two to three months and has been having increased  thirst, polyuria and  polydipsia. He denies any family history of diabetes mellitus.   REVIEW OF SYSTEMS:  He has headaches, nausea.  The rest of the review of  systems was in the HPI.   PAST MEDICAL HISTORY:  1.  Hepatitis C infection.  2.  IV drug abuse with heroin.   PAST SURGICAL HISTORY:  None.   MEDICATIONS:  None.   ALLERGIES:  No known drug allergies.   FAMILY HISTORY:  Mother is alive and well.  Father passed away from  myocardial infarction at age of 65.  His grandmother had colon cancer.   SOCIAL HISTORY:  He is a widower.  His wife passed away in 07/18/1994.  He has one  son who is 42 years old. The patient smokes cigarettes, one-and-a-half packs  per day.  He has been smoking for 30 years.  He does not drink alcohol. He  uses heroin.  No cocaine.  The patient is  currently unemployed.   PHYSICAL EXAMINATION:  VITAL SIGNS:  Temperature 98, blood pressure 121/55,  pulse 109, respiratory rate 20, oxygen saturation 98% on room air.  GENERAL:  On examination speech is slurred.  No in respiratory distress.  The patient is lying comfortably in bed.  HEENT: Normocephalic, atraumatic.  Extraocular muscle movement intact.  No  weakness noted now.  NECK:  No carotid bruits.  No elevated JVD.  No thyromegaly.  LUNGS:  Clear to auscultation.  CARDIOVASCULAR:  S1 and S2 regular.  No murmur, no gallop.  ABDOMEN:  Nondistended, s0oft, nontender.  Right groin has no hematoma  noted.  EXTREMITIES:  No pedal edema, no calf tenderness. Dorsalis pedis pulse 2+  bilaterally.  SKIN: The patient has multiple tattoos all over.  He has injection site  marked on his right cubital fossa.  There is no palpable abscess or  phlebitis noted.  CNS:  He has right facial weakness.  His tongue is deviated to the left.  He  has poor gag reflex on the right.  His soft palate appears to be weaker on  the right.  Cranial nerves II-VI appear intact.  Muscle power 5/5 in all  limbs tested.  I did not test  right lower limb secondary to restrictions.   INITIAL LABORATORY DATA:  Urinalysis insignificant.  Urine drug screen  positive for benzodiazepines and opiates.  Sodium 139, potassium 4.4,  chloride 101, bicarb 24, glucose 241, BUN 13, creatinine 1.4, total  bilirubin 0.8, alk phos 103, AST 29, ALT 31, total protein 7.2, albumin 4.2,  calcium 9.2.  PTT 27, PT 13.4, INR 1.  White cells 24.5, hemoglobin 16.9,  hematocrit 49.3, platelets 372, neutrophils 88, mild left shift with  occasional bands. Chest x-ray shows no acute abnormalities, minimal left  upper lobe atelectasis.  Head CT scan showed no intracranial abnormality.  MRI of the brain suggestive of large brain stem stroke.  EKG shows normal  sinus rhythm.  No acute ST changes.   ASSESSMENT/PLAN:  Alan Patel is a 50 year old Caucasian male presenting  with acute brain stem stroke.  He was not a candidate for tPA.  He underwent  temporary balloon occlusion as part of a clinical trial.  1.  Acute brain stem stroke.  Start Aspirin 300 mg p.r. and this can be      changed to p.o. when the patient's swallowing is assured.  Obtain      carotid Dopplers, 2-D echocardiogram, fasting lipid profile, hemoglobin      A1C, homocystine level, PT/OT evaluation, speech therapy for cognition      and speech rehab.  2.  New diagnosis of diabetes mellitus.  Most likely type 1 given the      patient's symptoms.  We initiate Lantus insulin 8 units subcutaneously      q.a.m. and place on hyperglycemic protocol as per ICU.  Diabetic      teaching and insulin teaching after hemoglobin A1C checked.  C peptide      insulin level and GAD antibodies.  3.  Leukocytosis of unclear etiology.  The patient has afebrile although he      has some cough.  Chest x-ray does not show any infiltrate.  There is a      question of atelectasis in the left upper lobe.  I will further evaluate      this with a lateral x-ray.  Obtain blood cultures. 4.  Substance abuse.   Heroin.  I noted the patient is  also positive for      benzodiazepines but there is no record of having been given      benzodiazepines in the emergency room.  The patient will be referred for      drug rehab counseling.  Will ask social work for that.  5.  History of hepatitis C infection.  He has normal liver enzymes.  We will      check hepatitis B and C serology.  The patient declines HIV testing.  6.  Tobacco abuse.  Will place on nicotine patch 21 mg daily and initiate      tobacco cessation counseling.  7.  Deep venous thrombosis prophylaxis.  Will give Lovenox 40 mg      subcutaneously daily.     Mobolaji B. Corky Downs, M.D.  Electronically Signed    MBB/MEDQ  D:  02/17/2005  T:  02/17/2005  Job:  [id number]

## 2010-07-08 NOTE — Consult Note (Signed)
NAME:  MANUS, WEEDMAN            ACCOUNT NO.:  [id number]   MEDICAL RECORD NO.:  [id number]          PATIENT TYPE:  INP   LOCATION:  3109                         FACILITY:  [facility]   PHYSICIAN:  Pramod P. Pearlean Brownie, MD    DATE OF BIRTH:  1960-03-30   DATE OF CONSULTATION:  DATE OF DISCHARGE:                                   CONSULTATION   REFERRING PHYSICIAN:  Lilyan Punt. Sydnee Levans, M.D.   PRIMARY DIAGNOSES:  Code stroke.   HISTORY OF PRESENT ILLNESS:  Mr. Cryer is a 50 year old Caucasian male  who woke up at about 6 a.m. today with slurred speech.  Denied further  weakness or clumsiness.  The patient states that he fell asleep at about 3  p.m.  He was fine before falling asleep.  This history was cooborated by his  mother who was at home with him.  The patient has continued to have slurred  speech, difficulty swallowing, and right-sided weakness and clumsiness after  arriving to the emergency room.  Code stroke was paged at 7:15 p.m.  The  patient arrived in the ER at 7:34.  I saw the patient at 7:55.  He had  clearly slurred speech, dysarthria, right-sided clumsiness, mild right-sided  weakness, and scored a 7 on the [facility] Stroke Scale.  He did not qualify for  IVT therapy secondary to time of onset being beyond three hours.  The  patient will be subsequently be admitted and evaluated for stroke.  The  patient denies any past medical history of hypertension, diabetes, ischemic  heart disease, hyperlipidemia.   PAST MEDICAL HISTORY:  1.  Chronic drug abuse with IV heroin.  He states that he did use IV heroin      earlier today.  2.  History of hepatitis C.   He does not take any medications and has not seen a medical doctor for  years.   HOME MEDICATIONS:  None.   MEDICATION ALLERGIES:  None.   PAST SURGICAL HISTORY:  None.   REVIEW OF SYSTEMS:  Not significant for headache today, but no prior history  of fevers, chills, cough, cold, or diarrhea.   SOCIAL HISTORY:  The  patient lives with his mother.  He abuses drugs and  smokes.   PHYSICAL EXAMINATION:  GENERAL:  Young, Caucasian male who appears to be in  distress.  VITAL SIGNS:  He is afebrile, pulse rate is tachycardic at 115 per minute  and sinus tachycardia, blood pressure 121/74, respiratory rate 18 per  minute.  EXTREMITIES:  Distal pulses well-felt.  SKIN:  The patient has multiple tattoos all over his body including his  legs.  I do not see any infected IV sites in the arm.  HEENT:  Head is nontraumatic.  ENT exam unremarkable.  NECK:  Supple without bruit.  CARDIAC:  Regular heart sounds.  LUNGS:  Clear to auscultation.  ABDOMEN:  Soft and nontender.  NEUROLOGIC:  The patient is drowsy, but can arouse easily and follow  commands appropriately.  He is oriented to time, place, and person.  He has  severe dysarthria.  His speech can be  understood with slight difficulty.  He  has weak cough and gag.  Movements are poor.  His tongue is midline,  movement is normal.  Pupils are 2 mm, vaguely reactive with full range with  slight restriction of eye in abduction.  There is no upper extremity drift,  however, there is mild right finger-to-nose dysmetria.  Grip strength is  symmetric.  He has mild weakness of the right leg with some drift.  There is  4+/5 strength of the left hip, foot and ankle dorsiflexors.  Coordination is  slow, but accurate on the right.  There is no touch or pinprick sensory  loss.  He is slightly ataxic and so his gait was not tested.   LABORATORY REVIEW:  CT scan of the head, non-contrast study, appears  unremarkable without any active abnormality.  Labs available so far include  white count elevated at 24.5.  Differential is not back yet.  PT, PTT, and  INR is normal.  Urinalysis and drug screen are pending at this time.   IMPRESSION:  A 50 year old gentleman with sudden onset of slurred speech,  dysphagia, right-sided weakness, and clumsiness secondary to large left   brain stem infarct in the setting of drug abuse.  He also has tachycardia  with elevated white count and may have underlying sepsis.   PLAN:  The patient has presented beyond the time for IV thrombolysis.  He  may potentially qualify for __________ stroke study, however, given his  history of drug abuse and possible ongoing sepsis he may not qualify for  either [facility] or the Sentis stroke studies.  The patient will be admitted to  the medical service and follow workup for his sepsis and drug abuse  problems.  IV hydration with normal saline.  Obtain an MRI scan of the brain  with MRI of the brain and neck.  Speech therapy consult for swallow  evaluation.  Physical therapy and occupational therapy consults as well as  rehabilitation consults.  Check carotid ultrasound, 2D echocardiogram,  fasting lipid profile, hemoglobin A1c, and homocystine.  DVT prophylaxis.           ______________________________  Sunny Schlein. Pearlean Brownie, MD     PPS/MEDQ  D:  02/16/2005  T:  02/17/2005  Job:  [id number]   cc:   Lilyan Punt. Sydnee Levans, M.D.  [address] [place]  [place] [address]

## 2010-07-08 NOTE — Op Note (Signed)
NAME:  Alan Patel, Alan Patel NO.:  [id number]   MEDICAL RECORD NO.:  [id number]          PATIENT TYPE:  INP   LOCATION:  3109                         FACILITY:  [facility]   PHYSICIAN:  Gabrielle Dare. Janee Morn, M.D.DATE OF BIRTH:  08/16/1960   DATE OF PROCEDURE:  02/28/2005  DATE OF DISCHARGE:                                 OPERATIVE REPORT   PREOPERATIVE DIAGNOSIS:  Respiratory failure, status post cerebrovascular  accident with need for enteral access.   POSTOPERATIVE DIAGNOSES:  Respiratory failure, status post cerebrovascular  accident with need for enteral access.   PROCEDURE:  Bedside tracheostomy and percutaneous endoscopic gastrostomy  tube placement.   SURGEON:  Violeta Gelinas.   ASSISTANT:  Earney Hamburg, [facility]   HISTORY OF PRESENT ILLNESS:  The patient is a 50 year old gentleman who was  admitted with cerebellar stroke. He has had a complicated course requiring  prolonged ventilatory dependence.  We are proceeding with bedside trache and  PEG today.   PROCEDURE IN DETAIL:  Informed consent was obtained from the patient's  mother by myself.  He remained monitored in his room in the neurosurgical  intensive care unit throughout the procedure.  The patient was identified.  We had turned our attention first to the PEG tube placement. The patient  received Norcuron first, then fentanyl and also remained on low dose  propofol infusion.  The esophagogastroduodenoscope was inserted into the  patient's mouth after placing a bite block down along the esophagus into the  stomach. No gross abnormalities in the esophagus were noted. Stomach was  insufflated. The scope was then passed through the duodenum down to the  junction of the second and third portion of the duodenum.  No ulcers or  other abnormalities were noted. The scope was drawn back and the stomach  insufflated fully with air. Palpation of the anterior abdominal wall  demonstrated a good location for poke.  The  area was prepped and draped in  sterile fashion. A small incision was made after injection with a percent  lidocaine with epinephrine.  The Angiocath was inserted under direct vision  into the stomach and the guidewire was placed through there.  The guidewire  was grasped with a snare via the endoscope and brought out through the mouth  by withdrawing the scope.  PEG tube was attached to the guidewire and  lubricated fully and pulled out through the anterior abdominal wall without  difficulty. The PEG tube was followed down into the stomach by the endoscope  for full visualization.  The flange was placed on the PEG tube to tighten it  down to approximately 3.5 on the tube.  This allowed it to just turn as  visualized from inside the stomach.  The scope was removed after evacuating  air from the stomach. The flange was attached to the PEG with a 3-0 silk  suture, double tied around the cuff of the flange.  A sterile dressing was  applied and the PEG tube was taped to the skin so as to avoid dislodgement.  The PEG will be placed to gravity. Attention was directed to the  tracheostomy.  A roll was placed behind the patient's shoulders.  His neck  was prepped and draped in sterile fashion. Transverse incision was made two  fingerbreadths cephalad to the sternal notch. Subcutaneous tissues were  dissected down revealing the strap muscles. These were divided along the  midline and tissues were cleared from the anterior portion of the trachea.  Site for entry was selected beneath the second tracheal ring under direct  palpation. Respiratory therapist then did withdrawal of the endotracheal  tube slowly to just above where we needed to enter.  At this time the  Angiocath was inserted from the [facility] kit, was inserted between the  second and third tracheal ring followed by the guidewire and the small blue  dilator. Next we used the [facility] dilator and then placed the 6 Shiley  trache tube  over 24 French blue dilator. This was hooked up to the  ventilator circuit and a carbon dioxide detector. Good color change was  noted and excellent volume returns were obtained. A sterile dressing was  applied. The trache was secured to the skin with four 3-0 nylon sutures, one  in each corner.  A Velcro trache strap was applied. The patient tolerated  the procedure well and saturations remained 100% throughout.  He remained  monitored in the neurosurgical intensive care unit. We will check a chest x-  ray.      Gabrielle Dare Janee Morn, M.D.  Electronically Signed     BET/MEDQ  D:  02/28/2005  T:  03/01/2005  Job:  [id number]   cc:   Danice Goltz, M.D. [facility]  [address] [place], [place] [address]   Pramod P. Pearlean Brownie, MD  Fax: [phone]

## 2010-07-08 NOTE — Consult Note (Signed)
NAME:  Alan Patel, Alan Patel NO.:  [id number]   MEDICAL RECORD NO.:  [id number]          PATIENT TYPE:  INP   LOCATION:  3109                         FACILITY:  [facility]   PHYSICIAN:  Gabrielle Dare. Janee Morn, M.D.DATE OF BIRTH:  May 11, 1960   DATE OF CONSULTATION:  02/24/2005  DATE OF DISCHARGE:                                   CONSULTATION   REASON FOR CONSULTATION:  Request for trach and PEG.   HISTORY OF PRESENT ILLNESS:  Mr. Calo is a 50 year old male patient  admitted on 02/16/05, after 3-6 hours estimated onset into an acute CVA.  He  is also status post aortogram with balloon occlusion of AAA.  This was  started the Sentis trial.  He subsequently suffered a cerebellar CVA with  hydrocephalus and aspiration pneumonia and ventilatory dependent respiratory  failure due to this.  He also has as history of tobacco abuse and  polysubstance abuse that includes heroine and benzodiazepines.  He is  currently intubated and CCM has been unable to wean and extubate the patient  secondary to altered mental status.  He also has a parenteral tube in place  for medications and nutrition.  The patient's recovery phase is expected to  be prolonged and a request has been made for placement of PEG and trach  tubes.   REVIEW OF SYSTEMS:  Unobtainable from the patient.  The patient step mom is  in the room.  States that patient was relatively healthy and ambulatory  prior to the stroke except for tobacco, alcohol and drug abuse which has  been ongoing for years.  To her recollection he has not had any difficulty  swallowing or other symptoms consistent with esophageal stricture.   SOCIAL HISTORY:  He is a widower.  His second wife committed suicide in  1996.  He has one son who is 51 and lives with the patient.  He does smoke.  He does drink alcohol and here benzodiazepines were found on his urine and  drug screen on admission.  His mother is next of kin, Alfonzo Feller [phone].   FAMILY MEDICAL HISTORY:  Obtained from the chart.  Father died at age 38 of  an MI.  His mother is healthy.   PAST MEDICAL HISTORY:  1.  Polysubstance abuse.  2.  History of tobacco abuse.  3.  History of hepatitis C.   PAST SURGICAL HISTORY:  Unknown.   ALLERGIES:  No known drug allergies.   MEDICATIONS:  None prior to admission.  In the hospital the patient is  currently on:  1.  Nicotine patch.  2.  Insulin per hyperglycemia protocol.  3.  DDAVP which is currently on hold.  4.  Peridex.  5.  Protonix.  6.  Zocor.  7.  Folic acid.  8.  Colace.  9.  Tylenol p.r.n.  10. Lantus insulin twice daily.  11. Zosyn.  12. Jevity tube feedings.  13. Thorazine IV p.r.n.  14. Zofran p.r.n.  15. Ativan p.r.n.   PHYSICAL EXAMINATION:  GENERAL:  Male patient currently intubated,  lethargic, requires tactile stimulation and loud voices to arouse.  VITAL  SIGNS:  Temperature 99.9 rectal, BP 112/68, pulse 80 and regular.  Respirations 13.  The patient opens eyes to stimulation as stated above.  He  does follow easy simple commands.  He has spontaneous purposeful movement of  extremities x 4.  His lower extremity strength is 3/5.  His upper extremity  strength is 1/5.  HEENT:  Head is normocephalic.  Sclerae not injected.  NECK:  Supple without adenopathy.  CHEST:  Bilateral lungs now coarse but clear to auscultation anteriorly.  The patient is intubated on the ventilator with 40% FIO2 and PEEP of 5.  Cardiac heart sounds are S1, S2.  No rubs, murmurs, thrills or gallops.  No  JVD.  He is maintaining sinus rhythm, sinus tach on telemetry.  ABDOMEN:  Soft, nontender, nondistended without hepatosplenomegaly, masses  or bruits.  He has obtained a tube feeding infusing at present.  EXTREMITIES:  Thin in appearance, but not atrophied.  No edema, cyanosis or  clubbing.  Pulses are palpable, 2+ bilaterally.  SKIN:  Intact.  No obvious abrasions or decubitus.  He has multiple tattoos  on his  extremities including 2 on the dorsum of each foot.   LABORATORIES:  Cultures from February 23, 2005 are pending.  Sodium 144,  potassium 3.5, BUN 23, creatinine 1.1, glucose 254.  WBC are 13,700.  Hemoglobin is 17.1.  Platelets are 422,000.  Homocystine level is 24.4.  BAL  sputum obtained on December 30 was negative.   DIAGNOSTICS:  EKG from December 28 shows sinus tachycardia ventricular rate  of 108, QTC of 430 milliseconds, no ischemic changes.  Portable chest x-ray  on 02/24/05, shows no acute lung disease.  EP tube and PICC line are in  place.   IMPRESSION:  1.  Status post cerebellar CVA with hydrocephalus and continued altered      mental status.  2.  Aspiration pneumonia with ventilator dependent respiratory failure      complicated by altered mental status and ability to wean.  3.  Secondary hyperglycemia.  Medication related secondary to Decadron.  4.  Polysubstance abuse including heroine and benzodiazepines and alcohol.  5.  Hepatitis C.  Positive with normal LFT.  6.  Tobacco abuse.  7.  Dyslipidemia.   PLAN:  I have discussed with patient with Dr. Janee Morn.  He has also  examined the patient.  We will proceed with trach and PEG early next week  depending OR availability.  Dr. Janee Morn will also need to discuss  procedure, risks  and benefits with the patient's mother to obtain consent.  Mother is not  available in the room at this time.  The patient's step mother will be  speaking with the mom initially since the patient's mom is easily anxious  over any new changes in the patient's plan of care.      Allison L. Rennis Harding, N.P.      Gabrielle Dare Janee Morn, M.D.  Electronically Signed    ALE/MEDQ  D:  02/24/2005  T:  02/24/2005  Job:  [id number]

## 2010-07-08 NOTE — Discharge Summary (Signed)
NAME:  Alan Patel, Alan Patel NO.:  [id number]   MEDICAL RECORD NO.:  [id number]          PATIENT TYPE:  INP   LOCATION:  3032                         FACILITY:  [facility]   PHYSICIAN:  Elliot Cousin, M.D.    DATE OF BIRTH:  [birth year]   DATE OF ADMISSION:  02/16/2005  DATE OF DISCHARGE:  03/11/2005                                 DISCHARGE SUMMARY   ADDENDUM:  This is an addendum discharge summary.  Please see the previous  interim discharge summary dictated by Dr. Lavera Guise, on January 14 ,2007.   HOSPITAL COURSE:  Please see the previous discharge summary dictated by Dr.  Lavera Guise.  During the hospitalization from January14 through January20,2007, the  patient improved progressively.  The only major issues were strep pneumonia  and tracheitis.  The patient had been started on Rocephin 1 g daily for  strep pneumonia earlier in the hospital course.  The patient continued to  develop low grade fevers on Rocephin.  A repeat chest x-ray on  January15,2007, revealed significant atelectasis and consolidation at the  left lower base.  In addition, the patient continued to have a moderate  amount of secretions from the tracheostomy.  Given the ongoing low grade  fevers and increased secretions, antibiotic therapy was broadened with  vancomycin and Zosyn.  Following the initiation of vancomycin and Zosyn, the  patient's fevers resolved, and the amount of tracheal secretions subsided.   The patient was reevaluated by the rehabilitation team at [facility].  He was felt to be an appropriate candidate for rehab.  He was  therefore discharged and transferred to the rehab unit under the care of Dr.  Hermelinda Medicus on January20,2007.  The patient had completed a 5-day course of  vancomycin and Zosyn prior to hospital discharge.  The recommendation was to  continue vancomycin and Zosyn for a total of 7 days.  Prior to hospital  discharge, the patient's tracheostomy was downsized to number 4.   DISCHARGE MEDICATIONS:  1.  Clonidine 0.1 mg b.i.d.  2.  Jevity tube feeding 1.2, 75 mL an hour.  3.  Seroquel 25 mg per tube b.i.d.  4.  Potassium chloride 40 mEq via PEG tube daily.  5.  Xopenex 0.63 nebulizer q.8 h.  6.  Lantus 30 units subcu q.12 h.  7.  Vancomycin IV per pharmacy.  8.  Zosyn 3.375 mg IV q.6 h.      Elliot Cousin, M.D.  Electronically Signed     DF/MEDQ  D:  05/26/2005  T:  05/27/2005  Job:  [id number]

## 2010-07-08 NOTE — Discharge Summary (Signed)
NAME:  OLVIN, ROHR NO.:  [id number]   MEDICAL RECORD NO.:  [id number]          PATIENT TYPE:  INP   LOCATION:  3103                         FACILITY:  [facility]   PHYSICIAN:  Lonia Blood, M.D.       DATE OF BIRTH:  11-13-1960   DATE OF ADMISSION:  02/16/2005  DATE OF DISCHARGE:                                 DISCHARGE SUMMARY   INTERIM DISCHARGE SUMMARY:  The patient was unassigned.   PROBLEM LIST AT TIME OF DICTATION:  1.  Acute cerebellar stroke with cerebral edema, mass effect and      hydrocephalus, status post right ventriculostomy by Dr. Phoebe Perch.  2.  Right frontal lobe hematoma.  3.  Hypertension.  4.  Status post ventilator dependent respiratory failure secondary to      intracranial event. Currently on T-collar, status post tracheostomy.  5.  Chronic hepatitis C.  6.  Chronic intravenous heroin abuse.  7.  Diabetes mellitus.  8/ Status post gastrostomy tube placement by Dr. Janee Morn from surgery.   PROCEDURES DURING THIS ADMISSION:  1.  The patient underwent an intra-aortic balloon pump placement on February 16, 2005 as part of a stroke trial. The balloon pump was removed on      February 17, 2005.  2.  Carotid Doppler ultrasound on February 17, 2005 showing a right internal      carotid artery stenosis of 40-60%.  3.  Ventriculostomy tube placement by Dr. Phoebe Perch from neurosurgery on      February 18, 2005.  4.  Oral tracheal intubation on February 18, 2005.  5.  The patient underwent PICC line placement in the right upper extremity      on February 21, 2005.  6.  Tracheostomy and gastrostomy tube placement on February 27, 2005 by Dr.      Janee Morn.   CONSULTATIONS:  1.  [facility] consulted throughout this      admission. Dr. Phoebe Perch from neurosurgery.  2.  [facility] consulted and assumed care from February 25, 2005 until March 03, 2005.  3.  Dr. Janee Morn from [facility] consulted  and performed a      tracheostomy and PEG tube placement.   For admission history and physical please refer to the dictated H&P done by  Dr. Corky Downs on February 16, 2005.   HOSPITAL COURSE:  PROBLEM #1. Altered mental status secondary to acute  cerebellar stroke. The patient was admitted to [facility] to the  intensive care unit. He was seen immediately by Dr. Pearlean Brownie from neurology for  a consultation. The patient was not a candidate for intravenous tPA and  because of that he was enrolled in a trial using an intra-aortic balloon  pump. The patient had initially made some good progression with his stroke.  On hospital day #2 his mental status altered further. An emergent MUGA scan  of his head showed the development of hydrocephalus. He was seen in  consultation by Dr. Phoebe Perch from neurosurgery who emergently placed a  ventriculostomy catheter. Because  of his increased intracranial pressure,  Mr. Eichorn respiratory status deteriorated too and he had to be  intubated on February 18, 2005. At that time the pulmonary critical care  team was called for consultation and they guided the care while the patient  was on the ventilator. The patient was stable throughout his stay in the ICU  on the ventilator. Because of his persistent altered mental status from the  hydrocephalus, he had to have a tracheostomy tube placed and was weaned to a  T-collar.  On March 03, 2005 the [facility] Team reassumed care of this  patient and he will continue with chronic weakening on T-collar and further  efforts will be made for physical therapy, occupational therapy, and  placement of Mr. Bingman.  PROBLEM #2. Hyperlipidemia. Mr. Hoogendoorn was started on a Statin here in the  hospital.  PROBLEM #3. Chronic hepatitis C. It does not seem to be active at this  point. Mr. Aydt's liver function tests are within normal limits. His  coagulation tests are within normal limits.  PROBLEM #4. Right  frontal hematoma. With placement of a ventriculostomy  drain Mr. Lares developed a right frontal hematoma. This is stable at the  time of my dictation.  This will be followed up by getting a MUGA scan and serial neurological  checks and neurology is also consulting on this patient.  PROBLEM #5. Febrile illness. Starting on February 22, 2005 Mr. Choyce  started developing fever. He had a culture of his blood, culture of his  bowel and urine. He grew Streptococcus pneumoniae in his bronchial lavage.  He was placed on intravenous Rocephin. At the time of my dictation, March 05, 2005, he still has low-grade temperatures on Rocephin. This time we are  pursing a new set of urine cultures. His low-grade temperature at this time  could also be due to his frontal hematoma that is absorbing.   This interim discharge summary was dictated by Dr. Lonia Blood.      Lonia Blood, M.D.  Electronically Signed     SL/MEDQ  D:  03/05/2005  T:  03/05/2005  Job:  [id number]   cc:   Clydene Fake, M.D.  Fax: [phone]

## 2010-07-08 NOTE — Assessment & Plan Note (Signed)
Alan Patel is back regarding his right PICA stroke with hydrocephalus with  resultant diplopia and right-sided ataxia and vertigo. Vertigo is improving  a bit but he is still having difficulties with his ataxia and diplopia.  His  ophthalmologist is planning surgery for realignment of his extraocular  muscles. He stopped Lexapro as he had side effects including swelling of his  tongue and some anxiety.  He complains of pain in the right shoulder and  right inguinal region today which is most noticeable with activity.  He  rates his pain as a 7 out of 10.  He states that pain interferes with his  general activities, relation to others, enjoyment of life on a moderate to  severe level.  His mother whom he lives with reports his behavior as awful  and severely agitated and depressed at times.  The patient uses a weighted  walker at home.  For longer distances, especially outside the home, he uses  a wheelchair.   On review of systems patient denies any new neurological, psychiatric,  constitutional, GU, GI, respiratory complaints other than those mentioned  above.  He is still involved in physical therapy.   SOCIAL HISTORY:  Pertinent positives listed above. The patient is currently  living with his mother.   PHYSICAL EXAMINATION:  VITAL SIGNS:  Blood pressure is 104/70, pulse is 74,  respiratory rate 16, he is saturating 96% in room air.   The patient is pleasant, in no acute distress. He is alert and oriented x3.  Affect appropriate. He smells of tobacco. Coordination was decreased on the  right side with significant ataxia noted in both the arm and leg. He is able  to transfer, sits to stand independently. He needs contact supervision for  gait.  He is not wearing appropriate shoe wear to walk today really.  Sensation is generally intact on the right side. He has difficulties with  movements of finger-to-nose and heel-to-shin today on the right side.  Cognitively he seemed to be within  normal limits today. Cranial nerve exam  is remarkable for diplopia and weakness of the right eye. Dentition was  poor.  Heart was regular rate and rhythm. Lungs were clear. Abdomen soft,  nontender. Right shoulder is painful with rotator cuff maneuvers.  Right  inguinal region was minimally tender with palpation today.   ASSESSMENT:  1.  Large right PICA stroke with diplopia, ataxia, dysphagia, and vertigo.      The patient continues to make gradual gains.  2.  Hypertension.  3.  Hepatitis C positive.  4.  Right rotator cuff syndrome.   PLAN:  1.  Continue outpatient PT and OT.  2.  Will stop Lexapro and begin a trial of Cymbalta 30 mg daily for the      first week, then up to 60 mg daily thereafter.  3.  Continue eye management per ophthalmology.  4.  Following informed consent we incidentally injected the right shoulder      via posterior approach with 40 mg of Kenalog and 3 cc 1% lidocaine.  5.  Encourage repetitive use and retraining of the right arm and leg to help      improve his stamina and coordination.      He still has time to improve physically although gains will be more      modest over the next several month's time.  6.  I will see the patient back in about 2 month's time here in the office.  Ranelle Oyster, M.D.  Electronically Signed     ZTS/MedQ  D:  08/08/2005 16:36:59  T:  08/08/2005 22:19:10  Job #:  [id number]

## 2010-07-08 NOTE — Op Note (Signed)
NAME:  Alan Patel, Alan Patel NO.:  [id number]   MEDICAL RECORD NO.:  [id number]          PATIENT TYPE:  INP   LOCATION:  3109                         FACILITY:  [facility]   PHYSICIAN:  Judie Petit, M.D. DATE OF BIRTH:  [birth year]   DATE OF PROCEDURE:  DATE OF DISCHARGE:                                 OPERATIVE REPORT   The anesthesia team was called to intubate this 50 year old male in  respiratory distress that pulled out his endotracheal tube tonight.  Vital  signs are stable on our arrival.  The patient was being masked by  respiratory therapy.  Rapid sequence induction with cricoid pressure.  200  mg of sodium pentothal IV and 100 mg of succinyl choline IV was given.  #2  Hyacinth Meeker was utilized and #8 endotracheal tube was placed by CRNA Bilotti  without difficulty on the initial attempt.  Positive bilateral breath  sounds.  Positive end tidal CO2 by EZ-Cap.  Patient tolerated the procedure  well.  Vital signs stable.  Chest x-ray pending.  Tube secured by RT.           ______________________________  Judie Petit, M.D.     CE/MEDQ  D:  02/28/2005  T:  02/28/2005  Job:  [id number]

## 2010-10-03 ENCOUNTER — Emergency Department (HOSPITAL_COMMUNITY)
Admission: EM | Admit: 2010-10-03 | Discharge: 2010-10-03 | Disposition: A | Discharge status: Home / Self Care | Payer: Medicare Other | Attending: Emergency Medicine | Admitting: Emergency Medicine

## 2010-10-03 DIAGNOSIS — R221 Localized swelling, mass and lump, neck: Secondary | ICD-10-CM | POA: Insufficient documentation

## 2010-10-03 DIAGNOSIS — I10 Essential (primary) hypertension: Secondary | ICD-10-CM | POA: Insufficient documentation

## 2010-10-03 DIAGNOSIS — K089 Disorder of teeth and supporting structures, unspecified: Secondary | ICD-10-CM | POA: Insufficient documentation

## 2010-10-03 DIAGNOSIS — E7800 Pure hypercholesterolemia, unspecified: Secondary | ICD-10-CM | POA: Insufficient documentation

## 2010-10-03 DIAGNOSIS — R220 Localized swelling, mass and lump, head: Secondary | ICD-10-CM | POA: Insufficient documentation

## 2010-10-03 DIAGNOSIS — K029 Dental caries, unspecified: Secondary | ICD-10-CM | POA: Insufficient documentation

## 2010-10-03 DIAGNOSIS — Z8673 Personal history of transient ischemic attack (TIA), and cerebral infarction without residual deficits: Secondary | ICD-10-CM | POA: Insufficient documentation

## 2011-07-21 ENCOUNTER — Encounter: Payer: Self-pay | Admitting: Family Medicine

## 2011-07-21 ENCOUNTER — Ambulatory Visit (INDEPENDENT_AMBULATORY_CARE_PROVIDER_SITE_OTHER): Payer: Medicare Other | Admitting: Family Medicine

## 2011-07-21 VITALS — BP 109/64 | HR 71 | Temp 98.1°F | Resp 18 | Ht 67.0 in | Wt 162.0 lb

## 2011-07-21 DIAGNOSIS — I10 Essential (primary) hypertension: Secondary | ICD-10-CM | POA: Insufficient documentation

## 2011-07-21 DIAGNOSIS — Z0000 Encounter for general adult medical examination without abnormal findings: Secondary | ICD-10-CM

## 2011-07-21 DIAGNOSIS — I6990 Unspecified sequelae of unspecified cerebrovascular disease: Secondary | ICD-10-CM

## 2011-07-21 DIAGNOSIS — Z720 Tobacco use: Secondary | ICD-10-CM

## 2011-07-21 DIAGNOSIS — F1911 Other psychoactive substance abuse, in remission: Secondary | ICD-10-CM | POA: Insufficient documentation

## 2011-07-21 DIAGNOSIS — F17200 Nicotine dependence, unspecified, uncomplicated: Secondary | ICD-10-CM

## 2011-07-21 DIAGNOSIS — B1920 Unspecified viral hepatitis C without hepatic coma: Secondary | ICD-10-CM | POA: Insufficient documentation

## 2011-07-21 DIAGNOSIS — E119 Type 2 diabetes mellitus without complications: Secondary | ICD-10-CM

## 2011-07-21 LAB — POCT URINALYSIS DIPSTICK
Bilirubin, UA: NEGATIVE
Blood, UA: NEGATIVE
Glucose, UA: NEGATIVE
Spec Grav, UA: <=1.005

## 2011-07-21 MED ORDER — BUPROPION HCL ER (XL) 150 MG PO TB24: 150.0000 mg | ORAL_TABLET | Freq: Every day | ORAL | Status: DC

## 2011-07-21 NOTE — Patient Instructions (Signed)
Smoking Cessation This document explains the best ways for you to quit smoking and new treatments to help. It lists new medicines that can double or triple your chances of quitting and quitting for good. It also considers ways to avoid relapses and concerns you may have about quitting, including weight gain. NICOTINE: A POWERFUL ADDICTION If you have tried to quit smoking, you know how hard it can be. It is hard because nicotine is a very addictive drug. For some people, it can be as addictive as heroin or cocaine. Usually, people make 2 or 3 tries, or more, before finally being able to quit. Each time you try to quit, you can learn about what helps and what hurts. Quitting takes hard work and a lot of effort, but you can quit smoking. QUITTING SMOKING IS ONE OF THE MOST IMPORTANT THINGS YOU WILL EVER DO.  You will live longer, feel better, and live better.   The impact on your body of quitting smoking is felt almost immediately:   Within 20 minutes, blood pressure decreases. Pulse returns to its normal level.   After 8 hours, carbon monoxide levels in the blood return to normal. Oxygen level increases.   After 24 hours, chance of heart attack starts to decrease. Breath, hair, and body stop smelling like smoke.   After 48 hours, damaged nerve endings begin to recover. Sense of taste and smell improve.   After 72 hours, the body is virtually free of nicotine. Bronchial tubes relax and breathing becomes easier.   After 2 to 12 weeks, lungs can hold more air. Exercise becomes easier and circulation improves.   Quitting will reduce your risk of having a heart attack, stroke, cancer, or lung disease:   After 1 year, the risk of coronary heart disease is cut in half.   After 5 years, the risk of stroke falls to the same as a nonsmoker.   After 10 years, the risk of lung cancer is cut in half and the risk of other cancers decreases significantly.   After 15 years, the risk of coronary heart  disease drops, usually to the level of a nonsmoker.   If you are pregnant, quitting smoking will improve your chances of having a healthy baby.   The people you live with, especially your children, will be healthier.   You will have extra money to spend on things other than cigarettes.  FIVE KEYS TO QUITTING Studies have shown that these 5 steps will help you quit smoking and quit for good. You have the best chances of quitting if you use them together: 1. Get ready.  2. Get support and encouragement.  3. Learn new skills and behaviors.  4. Get medicine to reduce your nicotine addiction and use it correctly.  5. Be prepared for relapse or difficult situations. Be determined to continue trying to quit, even if you do not succeed at first.  1. GET READY  Set a quit date.   Change your environment.   Get rid of ALL cigarettes, ashtrays, matches, and lighters in your home, car, and place of work.   Do not let people smoke in your home.   Review your past attempts to quit. Think about what worked and what did not.   Once you quit, do not smoke. NOT EVEN A PUFF!  2. GET SUPPORT AND ENCOURAGEMENT Studies have shown that you have a better chance of being successful if you have help. You can get support in many ways.  Tell   your family, friends, and coworkers that you are going to quit and need their support. Ask them not to smoke around you.   Talk to your caregivers (doctor, dentist, nurse, pharmacist, psychologist, and/or smoking counselor).   Get individual, group, or telephone counseling and support. The more counseling you have, the better your chances are of quitting. Programs are available at local hospitals and health centers. Call your local health department for information about programs in your area.   Spiritual beliefs and practices may help some smokers quit.   Quit meters are small computer programs online or downloadable that keep track of quit statistics, such as amount  of "quit-time," cigarettes not smoked, and money saved.   Many smokers find one or more of the many self-help books available useful in helping them quit and stay off tobacco.  3. LEARN NEW SKILLS AND BEHAVIORS  Try to distract yourself from urges to smoke. Talk to someone, go for a walk, or occupy your time with a task.   When you first try to quit, change your routine. Take a different route to work. Drink tea instead of coffee. Eat breakfast in a different place.   Do something to reduce your stress. Take a hot bath, exercise, or read a book.   Plan something enjoyable to do every day. Reward yourself for not smoking.   Explore interactive web-based programs that specialize in helping you quit.  4. GET MEDICINE AND USE IT CORRECTLY Medicines can help you stop smoking and decrease the urge to smoke. Combining medicine with the above behavioral methods and support can quadruple your chances of successfully quitting smoking. The [place] Food and Drug Administration (FDA) has approved 7 medicines to help you quit smoking. These medicines fall into 3 categories.  Nicotine replacement therapy (delivers nicotine to your body without the negative effects and risks of smoking):   Nicotine gum: Available over-the-counter.   Nicotine lozenges: Available over-the-counter.   Nicotine inhaler: Available by prescription.   Nicotine nasal spray: Available by prescription.   Nicotine skin patches (transdermal): Available by prescription and over-the-counter.   Antidepressant medicine (helps people abstain from smoking, but how this works is unknown):   Bupropion sustained-release (SR) tablets: Available by prescription.   Nicotinic receptor partial agonist (simulates the effect of nicotine in your brain):   Varenicline tartrate tablets: Available by prescription.   Ask your caregiver for advice about which medicines to use and how to use them. Carefully read the information on the package.    Everyone who is trying to quit may benefit from using a medicine. If you are pregnant or trying to become pregnant, nursing an infant, you are under age 18, or you smoke fewer than 10 cigarettes per day, talk to your caregiver before taking any nicotine replacement medicines.   You should stop using a nicotine replacement product and call your caregiver if you experience nausea, dizziness, weakness, vomiting, fast or irregular heartbeat, mouth problems with the lozenge or gum, or redness or swelling of the skin around the patch that does not go away.   Do not use any other product containing nicotine while using a nicotine replacement product.   Talk to your caregiver before using these products if you have diabetes, heart disease, asthma, stomach ulcers, you had a recent heart attack, you have high blood pressure that is not controlled with medicine, a history of irregular heartbeat, or you have been prescribed medicine to help you quit smoking.  5. BE PREPARED FOR RELAPSE OR   DIFFICULT SITUATIONS  Most relapses occur within the first 3 months after quitting. Do not be discouraged if you start smoking again. Remember, most people try several times before they finally quit.   You may have symptoms of withdrawal because your body is used to nicotine. You may crave cigarettes, be irritable, feel very hungry, cough often, get headaches, or have difficulty concentrating.   The withdrawal symptoms are only temporary. They are strongest when you first quit, but they will go away within 10 to 14 days.  Here are some difficult situations to watch for:  Alcohol. Avoid drinking alcohol. Drinking lowers your chances of successfully quitting.   Caffeine. Try to reduce the amount of caffeine you consume. It also lowers your chances of successfully quitting.   Other smokers. Being around smoking can make you want to smoke. Avoid smokers.   Weight gain. Many smokers will gain weight when they quit, usually  less than 10 pounds. Eat a healthy diet and stay active. Do not let weight gain distract you from your main goal, quitting smoking. Some medicines that help you quit smoking may also help delay weight gain. You can always lose the weight gained after you quit.   Bad mood or depression. There are a lot of ways to improve your mood other than smoking.  If you are having problems with any of these situations, talk to your caregiver. SPECIAL SITUATIONS AND CONDITIONS Studies suggest that everyone can quit smoking. Your situation or condition can give you a special reason to quit.  Pregnant women/new mothers: By quitting, you protect your baby's health and your own.   Hospitalized patients: By quitting, you reduce health problems and help healing.   Heart attack patients: By quitting, you reduce your risk of a second heart attack.   Lung, head, and neck cancer patients: By quitting, you reduce your chance of a second cancer.   Parents of children and adolescents: By quitting, you protect your children from illnesses caused by secondhand smoke.  QUESTIONS TO THINK ABOUT Think about the following questions before you try to stop smoking. You may want to talk about your answers with your caregiver.  Why do you want to quit?   If you tried to quit in the past, what helped and what did not?   What will be the most difficult situations for you after you quit? How will you plan to handle them?   Who can help you through the tough times? Your family? Friends? Caregiver?   What pleasures do you get from smoking? What ways can you still get pleasure if you quit?  Here are some questions to ask your caregiver:  How can you help me to be successful at quitting?   What medicine do you think would be best for me and how should I take it?   What should I do if I need more help?   What is smoking withdrawal like? How can I get information on withdrawal?  Quitting takes hard work and a lot of effort,  but you can quit smoking. FOR MORE INFORMATION  Smokefree.gov (http://www.davis-sullivan.com/) provides free, accurate, evidence-based information and professional assistance to help support the immediate and long-term needs of people trying to quit smoking. Document Released: 01/31/2001 Document Revised: 01/26/2011 Document Reviewed: 11/23/2008 [facility] Patient Information 2012 [place], [place]     .Keeping you healthy  Get these tests  Blood pressure- Have your blood pressure checked once a year by your healthcare provider.  Normal blood pressure is  120/80  Weight- Have your body mass index (BMI) calculated to screen for obesity.  BMI is a measure of body fat based on height and weight. You can also calculate your own BMI at [email or web address].  Cholesterol- Have your cholesterol checked every year.  Diabetes- Have your blood sugar checked regularly if you have high blood pressure, high cholesterol, have a family history of diabetes or if you are overweight.  Screening for Colon Cancer- Colonoscopy starting at age 28.  Screening may begin sooner depending on your family history and other health conditions. Follow up colonoscopy as directed by your Gastroenterologist.  Screening for Prostate Cancer- Both blood work (PSA) and a rectal exam help screen for Prostate Cancer.  Screening begins at age 75 with African-American men and at age 60 with Caucasian men.  Screening may begin sooner depending on your family history.  Take these medicines  Aspirin- One aspirin daily can help prevent Heart disease and Stroke.  Flu shot- Every fall.  Tetanus- Every 10 years.  Zostavax- Once after the age of 10 to prevent Shingles.  Pneumonia shot- Once after the age of 73; if you are younger than 33, ask your healthcare provider if you need a Pneumonia shot.  Take these steps  Don't smoke- If you do smoke, talk to your doctor about quitting.  For tips on how to quit, go to www.smokefree.gov or  call 1-800-QUIT-NOW.  Be physically active- Exercise 5 days a week for at least 30 minutes.  If you are not already physically active start slow and gradually work up to 30 minutes of moderate physical activity.  Examples of moderate activity include walking briskly, mowing the yard, dancing, swimming, bicycling, etc.  Eat a healthy diet- Eat a variety of healthy food such as fruits, vegetables, low fat milk, low fat cheese, yogurt, lean meant, poultry, fish, beans, tofu, etc. For more information go to www.thenutritionsource.org  Drink alcohol in moderation- Limit alcohol intake to less than two drinks a day. Never drink and drive.  Dentist- Brush and floss twice daily; visit your dentist twice a year.  Depression- Your emotional health is as important as your physical health. If you're feeling down, or losing interest in things you would normally enjoy please talk to your healthcare provider.  Eye exam- Visit your eye doctor every year.  Safe sex- If you may be exposed to a sexually transmitted infection, use a condom.  Seat belts- Seat belts can save your life; always wear one.  Smoke/Carbon Monoxide detectors- These detectors need to be installed on the appropriate level of your home.  Replace batteries at least once a year.  Skin cancer- When out in the sun, cover up and use sunscreen 15 SPF or higher.  Violence- If anyone is threatening you, please tell your healthcare provider.  Living Will/ Health care power of attorney- Speak with your healthcare provider and family.

## 2011-07-25 NOTE — Progress Notes (Addendum)
Subjective:    Patient ID: Alan Patel, male    DOB: 1960-03-31, 51 y.o.   MRN: [id number]  HPI   This 51 y.o. Cauc male is new pt at 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  He is a recovering addict and alcoholic and continues to use tobacco. He is interested in quitting   He also has Diabetes and is compliant with  medications and nutrition; he does FSBS at home.  His current complaints of congestion with cough are related to his smoking. He has problems with  walking secondary to CVA and joint pain. Also has some chronic dizziness and visual disturbance  associated with CVA. Pt reports Hep C for years; taking milk thistle to promote liver health.   Previous medical care at [facility] with Dr. Maryelizabeth Rowan.    Review of Systems  HENT: Positive for hearing loss, congestion, rhinorrhea, dental problem and sinus pressure. Negative for sore throat, sneezing, drooling, trouble swallowing, neck pain, neck stiffness and postnasal drip.        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 Negative for choking, chest tightness, shortness of breath and wheezing.   Cardiovascular: Negative for chest pain, palpitations and leg swelling.  Gastrointestinal: Negative.   Genitourinary: Negative.   Musculoskeletal: Positive for arthralgias and gait problem.  Skin: Negative.   Neurological: Positive for dizziness, facial asymmetry, speech difficulty, weakness and numbness. Negative for tremors, seizures, syncope, light-headedness and headaches.  Hematological: Negative.   Psychiatric/Behavioral: Negative.         Objective:   Physical Exam  Vitals reviewed. Constitutional: He is oriented to person, place, and time. He appears well-developed and well-nourished. No distress.  HENT:  Head: Normocephalic and atraumatic.  Right Ear: External ear normal.  Left Ear: External ear normal.  Mouth/Throat: Oropharynx is clear and moist. No oropharyngeal  exudate.  Eyes: Conjunctivae and EOM are normal. Pupils are equal, round, and reactive to light. Right eye exhibits no discharge. Left eye exhibits no discharge. No scleral icterus.  Neck: Normal range of motion. Neck supple. No thyromegaly present.  Cardiovascular: Normal rate, regular rhythm and normal heart sounds.  Exam reveals no gallop and no friction rub.   No murmur heard. Pulmonary/Chest: Effort normal and breath sounds normal. No respiratory distress. He has no wheezes. He has no rales.  Abdominal: Soft. Bowel sounds are normal. He exhibits no mass. There is no tenderness. There is no guarding.       No organomegaly  Genitourinary: Prostate normal and penis normal. Rectal exam shows external hemorrhoid. Rectal exam shows no mass, no tenderness and anal tone normal. Guaiac negative stool. Right testis shows no mass, no swelling and no tenderness. Right testis is descended. Left testis shows no mass, no swelling and no tenderness. Left testis is descended.  Musculoskeletal: Normal range of motion. He exhibits no edema.  Lymphadenopathy:    He has no cervical adenopathy.       Right: No inguinal adenopathy present.       Left: No inguinal adenopathy present.  Neurological: He is alert and oriented to person, place, and time. He has normal reflexes. No cranial nerve deficit. He exhibits normal muscle tone. Coordination abnormal.       Right sided weakness- motor strength 3+/5 Pt ambulates with cane to steady gait  Skin: Skin is warm and dry.  Psychiatric: He has a normal mood and affect. His behavior is normal. Judgment and thought content  normal.    Results for orders placed in visit on 07/21/11  POCT URINALYSIS DIPSTICK      Component Value Range   Color, UA yellow     Clarity, UA clear     Glucose, UA neg     Bilirubin, UA neg     Ketones, UA neg     Spec Grav, UA <=1.005     Blood, UA neg     pH, UA 6.0     Protein, UA neg     Urobilinogen, UA 0.2     Nitrite, UA neg      Leukocytes, UA Negative    IFOBT (OCCULT BLOOD)      Component Value Range   IFOBT Negative           Assessment & Plan:   1. Routine general medical examination at a health care facility  Labs not done because pt states he had blood work recently and will provide copy of results   2. HTN (hypertension) - excellent control Continue current medication and monitor  3. Type II diabetes mellitus  Continue current medication  4. Tobacco user  RX: Wellbutrin XL 150 mg start at 1 tablet daily ; will reassess if dose to be increased at next visit. 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

## 2011-08-18 ENCOUNTER — Ambulatory Visit (INDEPENDENT_AMBULATORY_CARE_PROVIDER_SITE_OTHER): Payer: Medicare Other | Admitting: Family Medicine

## 2011-08-18 VITALS — BP 108/68 | HR 76 | Temp 98.2°F | Resp 18 | Ht 67.5 in | Wt 159.0 lb

## 2011-08-18 DIAGNOSIS — E119 Type 2 diabetes mellitus without complications: Secondary | ICD-10-CM

## 2011-08-18 DIAGNOSIS — M898X9 Other specified disorders of bone, unspecified site: Secondary | ICD-10-CM

## 2011-08-18 DIAGNOSIS — R69 Illness, unspecified: Secondary | ICD-10-CM

## 2011-08-18 DIAGNOSIS — E785 Hyperlipidemia, unspecified: Secondary | ICD-10-CM

## 2011-08-18 DIAGNOSIS — M899 Disorder of bone, unspecified: Secondary | ICD-10-CM

## 2011-08-18 DIAGNOSIS — M949 Disorder of cartilage, unspecified: Secondary | ICD-10-CM

## 2011-08-18 LAB — CBC WITH DIFFERENTIAL/PLATELET
Basophils Relative: 0 % (ref 0–1)
Eosinophils Absolute: 0.2 10*3/uL (ref 0.0–0.7)
HCT: 44.2 % (ref 39.0–52.0)
Lymphocytes Relative: 27 % (ref 12–46)
Lymphs Abs: 2 10*3/uL (ref 0.7–4.0)
MCH: 31 pg (ref 26.0–34.0)
MCHC: 34.4 g/dL (ref 30.0–36.0)
MCV: 90.2 fL (ref 78.0–100.0)
Neutro Abs: 4.6 10*3/uL (ref 1.7–7.7)
Neutrophils Relative %: 64 % (ref 43–77)
Platelets: 352 10*3/uL (ref 150–400)
WBC: 7.3 10*3/uL (ref 4.0–10.5)

## 2011-08-18 NOTE — Patient Instructions (Signed)
Continue your current medications and try to eat as healthy as possible. Stay well hydrated especially during the summer months.

## 2011-08-18 NOTE — Progress Notes (Signed)
  Subjective:    Patient ID: Alan Patel, male    DOB: 1960/06/26, 51 y.o.   MRN: [id number]  HPI   Pt is a 51 y.o. Diabetic who suffered a CVA in 2006 due to substance abuse. He has remained "clean"  since that time and states compliance with medications and nutrition (occassional sugar-free snack or drink).  He needs Glucometer strips for "One Touch Ultra mini".   He c/o right-sided weakness and problems with coordinating ambulation; walks with cane. Denies tripping or  stumbling when ambulating. Denies dizziness or lightheadedness or any new weakness or numbness.  Maintains adequate hydration. Follows a good nutrition plan- 2 meals a day with snack. FSBS not above 160.  Review of Systems As per HPI     Objective:   Physical Exam  Vitals reviewed. Constitutional: He is oriented to person, place, and time. He appears well-developed and well-nourished. No distress.  HENT:  Head: Normocephalic and atraumatic.  Eyes: Conjunctivae and EOM are normal. No scleral icterus.  Cardiovascular: Normal rate.   Pulmonary/Chest: No respiratory distress.  Musculoskeletal:       Left-sided spasticity associated with old CVA; no deformities  Neurological: He is alert and oriented to person, place, and time. No cranial nerve deficit. He exhibits abnormal muscle tone. Coordination abnormal.       Compared to last visit, pt is at baseline  Skin: Skin is warm and dry. No pallor.  Psychiatric: He has a normal mood and affect. His behavior is normal. Judgment and thought content normal.    A1C= 6.0%      Assessment & Plan:   1. Type II or unspecified type diabetes mellitus without mention of complication, not stated as uncontrolled  Comprehensive metabolic panel Continue Metformin  2. Dyslipidemia - pt taking Wel-Chol and Fish Oil  Lipid panel  3. Bone pain  CBC with Differential, Vitamin D, 25-hydroxy  4. Taking multiple medications for chronic disease  CBC with Differential

## 2011-08-19 LAB — LIPID PANEL
Cholesterol: 138 mg/dL (ref 0–200)
HDL: 46 mg/dL (ref >39)
Total CHOL/HDL Ratio: 3 Ratio
Triglycerides: 110 mg/dL (ref <150)

## 2011-08-19 LAB — COMPREHENSIVE METABOLIC PANEL
Alkaline Phosphatase: 53 U/L (ref 39–117)
BUN: 9 mg/dL (ref 6–23)
Calcium: 9.9 mg/dL (ref 8.4–10.5)
Chloride: 106 mEq/L (ref 96–112)
Potassium: 4.3 mEq/L (ref 3.5–5.3)
Total Protein: 6.7 g/dL (ref 6.0–8.3)

## 2011-08-20 ENCOUNTER — Encounter: Payer: Self-pay | Admitting: Family Medicine

## 2011-08-20 NOTE — Progress Notes (Signed)
Quick Note:  Please notify pt that results are normal.   Provide pt with copy of labs. ______ 

## 2011-08-21 ENCOUNTER — Encounter: Payer: Self-pay | Admitting: *Deleted

## 2011-09-05 ENCOUNTER — Encounter: Payer: Self-pay | Admitting: Family Medicine

## 2011-11-17 ENCOUNTER — Encounter: Payer: Self-pay | Admitting: Family Medicine

## 2011-11-17 ENCOUNTER — Ambulatory Visit (INDEPENDENT_AMBULATORY_CARE_PROVIDER_SITE_OTHER): Payer: Medicare Other | Admitting: Family Medicine

## 2011-11-17 VITALS — BP 110/64 | HR 79 | Temp 98.0°F | Resp 16 | Ht 65.5 in | Wt 159.0 lb

## 2011-11-17 DIAGNOSIS — Z23 Encounter for immunization: Secondary | ICD-10-CM

## 2011-11-17 DIAGNOSIS — E119 Type 2 diabetes mellitus without complications: Secondary | ICD-10-CM

## 2011-11-17 DIAGNOSIS — Z760 Encounter for issue of repeat prescription: Secondary | ICD-10-CM

## 2011-11-17 MED ORDER — METFORMIN HCL 500 MG PO TABS: 500.0000 mg | ORAL_TABLET | Freq: Every day | ORAL | Status: DC

## 2011-11-17 MED ORDER — SIMVASTATIN 80 MG PO TABS: 80.0000 mg | ORAL_TABLET | Freq: Every day | ORAL | Status: DC

## 2011-11-17 MED ORDER — COLESEVELAM HCL 625 MG PO TABS: ORAL_TABLET | ORAL | Status: DC

## 2011-11-17 MED ORDER — DULOXETINE HCL 60 MG PO CPEP: 60.0000 mg | ORAL_CAPSULE | Freq: Every day | ORAL | Status: DC

## 2011-11-17 MED ORDER — LISINOPRIL 5 MG PO TABS: 5.0000 mg | ORAL_TABLET | Freq: Every day | ORAL | Status: DC

## 2011-11-17 NOTE — Patient Instructions (Signed)
Shingles Vaccine What You Need to Know WHAT IS SHINGLES?  Shingles is a painful skin rash, often with blisters. It is also called Herpes Zoster or just Zoster.   A shingles rash usually appears on one side of the face or body and lasts from 2 to 4 weeks. Its main symptom is pain, which can be quite severe. Other symptoms of shingles can include fever, headache, chills, and upset stomach. Very rarely, a shingles infection can lead to pneumonia, hearing problems, blindness, brain inflammation (encephalitis), or death.   For about 1 person in 5, severe pain can continue even after the rash clears up. This is called post-herpetic neuralgia.   Shingles is caused by the Varicella Zoster virus. This is the same virus that causes chickenpox. Only someone who has had a case of chickenpox or rarely, has gotten chickenpox vaccine, can get shingles. The virus stays in your body. It can reappear many years later to cause a case of shingles.   You cannot catch shingles from another person with shingles. However, a person who has never had chickenpox (or chickenpox vaccine) could get chickenpox from someone with shingles. This is not very common.   Shingles is far more common in people 50 and older than in younger people. It is also more common in people whose immune systems are weakened because of a disease such as cancer or drugs such as steroids or chemotherapy.   At least 1 million people get shingles per year in the [place].  SHINGLES VACCINE  A vaccine for shingles was licensed in 2006. In clinical trials, the vaccine reduced the risk of shingles by 50%. It can also reduce the pain in people who still get shingles after being vaccinated.   A single dose of shingles vaccine is recommended for adults 60 years of age and older.  SOME PEOPLE SHOULD NOT GET SHINGLES VACCINE OR SHOULD WAIT A person should not get shingles vaccine if he or she:  Has ever had a life-threatening allergic reaction to  gelatin, the antibiotic neomycin, or any other component of shingles vaccine. Tell your caregiver if you have any severe allergies.   Has a weakened immune system because of current:   AIDS or another disease that affects the immune system.   Treatment with drugs that affect the immune system, such as prolonged use of high-dose steroids.   Cancer treatment, such as radiation or chemotherapy.   Cancer affecting the bone marrow or lymphatic system, such as leukemia or lymphoma.   Is pregnant, or might be pregnant. Women should not become pregnant until at least 4 weeks after getting shingles vaccine.  Someone with a minor illness, such as a cold, may be vaccinated. Anyone with a moderate or severe acute illness should usually wait until he or she recovers before getting the vaccine. This includes anyone with a temperature of 101.3 F (38 C) or higher. WHAT ARE THE RISKS FROM SHINGLES VACCINE?  A vaccine, like any medicine, could possibly cause serious problems, such as severe allergic reactions. However, the risk of a vaccine causing serious harm, or death, is extremely small.   No serious problems have been identified with shingles vaccine.  Mild Problems  Redness, soreness, swelling, or itching at the site of the injection (about 1 person in 3).   Headache (about 1 person in 70).  Like all vaccines, shingles vaccine is being closely monitored for unusual or severe problems. WHAT IF THERE IS A MODERATE OR SEVERE REACTION? What should I   look for? Any unusual condition, such as a severe allergic reaction or a high fever. If a severe allergic reaction occurred, it would be within a few minutes to an hour after the shot. Signs of a serious allergic reaction can include difficulty breathing, weakness, hoarseness or wheezing, a fast heartbeat, hives, dizziness, paleness, or swelling of the throat. What should I do?  Call your caregiver, or get the person to a caregiver right away.   Tell  the caregiver what happened, the date and time it happened, and when the vaccination was given.   Ask the caregiver to report the reaction by filing a Vaccine Adverse Event Reporting System (VAERS) form. Or, you can file this report through the VAERS web site at www.vaers.hhs.gov or by calling 1-800-822-7967.  VAERS does not provide medical advice. HOW CAN I LEARN MORE?  Ask your caregiver. He or she can give you the vaccine package insert or suggest other sources of information.   Contact the Centers for Disease Control and Prevention (CDC):   Call 1-800-232-4636 (1-800-CDC-INFO).   Visit the CDC website at www.cdc.gov/vaccines  CDC Shingles Vaccine VIS (11/26/07) Document Released: 12/04/2005 Document Revised: 01/26/2011 Document Reviewed: 11/26/2007 Exit[employer] Patient Information 2012 [employer]. 

## 2011-11-18 NOTE — Progress Notes (Signed)
S; This 51 y.o. Cauc male returns for follow-up. He has been very compliant with medications, nutrition and lifestyle changes. He follows a regimen daily such that there is not much variability in diet or activity. FSBS= 80-110. No hypoglycemia.  In general, he feels good. He does continue to smoke. He is asking for follow-up re: Hepatitis C (advised him that most recent LFTs were normal). Will do labs at next visit.  ROS: Negative for fatigue, unexpected weight loss, fever, CP or tightness, palpitations, cough, SOB, wheezes, GI/GU problems, HA, dizziness, numbness, weakness or syncope.   O:  Filed Vitals:   11/17/11 0939  BP: 110/64  Pulse: 79  Temp: 98 F (36.7 C)  Resp: 16   GEN: In NAD;WN,WD. HENT: [place]/AT; EOMI, conj/scl clear. COR: RRR. LUNGS: Normal resp rate and effort. NEURO: A&O x 3; CNs intact except mild dysarthria and hemiplegia (stable and at baseline). Pt ambulates with a cane.   A/P:  1. Type II or unspecified type diabetes mellitus without mention of complication, not stated as uncontrolled  Last A1C in June 2013= 6.0% No medication changes  2. Medication refill  All requested RFs addressed.  3. Need for prophylactic vaccination and inoculation against influenza  Flu vaccine greater than or equal to 3yo preservative free IM    Pt given RX:  Zostavax to take to [facility] or [facility] for administration by Teacher, early years/pre.

## 2012-01-23 ENCOUNTER — Telehealth: Payer: Self-pay

## 2012-01-23 NOTE — Telephone Encounter (Signed)
This was sent in on 9/27 with refills, will call patient.

## 2012-01-23 NOTE — Telephone Encounter (Signed)
Dr.Mcpherson, Pt would like a refill on cymbalta, he states that it was originally filled by another doctor but he has discussed this medication with you. Best# [id number]

## 2012-01-23 NOTE — Telephone Encounter (Signed)
I have advised him of the medication renewal which was sent to [facility]

## 2012-02-16 ENCOUNTER — Ambulatory Visit (INDEPENDENT_AMBULATORY_CARE_PROVIDER_SITE_OTHER): Payer: Medicare Other | Admitting: Family Medicine

## 2012-02-16 VITALS — BP 111/77 | HR 83 | Temp 98.3°F | Resp 18 | Ht 67.0 in | Wt 166.0 lb

## 2012-02-16 DIAGNOSIS — E119 Type 2 diabetes mellitus without complications: Secondary | ICD-10-CM

## 2012-02-16 DIAGNOSIS — Z760 Encounter for issue of repeat prescription: Secondary | ICD-10-CM

## 2012-02-16 DIAGNOSIS — I10 Essential (primary) hypertension: Secondary | ICD-10-CM

## 2012-02-16 LAB — BASIC METABOLIC PANEL
BUN: 9 mg/dL (ref 6–23)
CO2: 30 mEq/L (ref 19–32)
Creat: 0.66 mg/dL (ref 0.50–1.35)
Glucose, Bld: 115 mg/dL — ABNORMAL HIGH (ref 70–99)
Potassium: 4.5 mEq/L (ref 3.5–5.3)
Sodium: 140 mEq/L (ref 135–145)

## 2012-02-16 LAB — POCT GLYCOSYLATED HEMOGLOBIN (HGB A1C): Hemoglobin A1C: 5.9

## 2012-02-16 MED ORDER — DULOXETINE HCL 60 MG PO CPEP: 60.0000 mg | ORAL_CAPSULE | Freq: Every day | ORAL | Status: DC

## 2012-02-16 MED ORDER — COLESEVELAM HCL 625 MG PO TABS: ORAL_TABLET | ORAL | Status: DC

## 2012-02-16 MED ORDER — BUPROPION HCL ER (XL) 150 MG PO TB24: 150.0000 mg | ORAL_TABLET | Freq: Every day | ORAL | Status: DC

## 2012-02-16 MED ORDER — SIMVASTATIN 80 MG PO TABS: 80.0000 mg | ORAL_TABLET | Freq: Every day | ORAL | Status: DC

## 2012-02-16 MED ORDER — METFORMIN HCL 500 MG PO TABS: 500.0000 mg | ORAL_TABLET | Freq: Every day | ORAL | Status: DC

## 2012-02-16 MED ORDER — LISINOPRIL 5 MG PO TABS: 5.0000 mg | ORAL_TABLET | Freq: Every day | ORAL | Status: DC

## 2012-02-16 NOTE — Patient Instructions (Signed)
You will be due for complete physical in May 2014. I have refilled all your medications so that you will not run out before then. All medications are the same provided you continue your good health practices and work on weight reduction ( goal weight is 150-160 pounds).

## 2012-02-19 NOTE — Progress Notes (Signed)
Quick Note:  Please notify pt that results are normal.   Provide pt with copy of labs. ______ 

## 2012-02-20 ENCOUNTER — Encounter: Payer: Self-pay | Admitting: Family Medicine

## 2012-02-20 NOTE — Progress Notes (Signed)
S: This 50 y.o. Cauc male returns for Diabetes and HTN follow-up with medication refills. 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 He monitors his nutrition and FSBS and  reports readings are stable. He admits to enjoying to much food and gaining weight during the Holidays but is mindful of need to maintain some control. He has not been as active due to weather.  ROS: Negative for fatigue, CP or tightness, palpitations, edema, SOB or DOE, HA, numbness or vision changes, weakness or syncope.  OCeasar Mons Vitals:   02/16/12 0955  BP: 111/77                                             Weight is up 7 lbs.  Pulse: 83  Temp: 98.3 F (36.8 C)  Resp: 18   GEN: In NAD; WN,WD. HENT: [place]/AT; EOMI w/ clear conj and sclerae. Oroph is moist. Otherwise unremarkable. COR: RRR. No edema. LUNGS: Normal resp rate and effort. NEURO: A&O x 3; CNs intact except mild dysarthria noted. No new findings- pt is at baseline.   A1c= 5.9%   A/P:  1. Type II or unspecified type diabetes mellitus without mention of complication- controlled   Basic metabolic panel, LDL Cholesterol, Direct Continue current treatment plan and lifestyle modifications (advise to reduce weight to ~ 155- 160 lbs)  2. HTN, goal below 140/80 - stable No change in treatment.  3. Encounter for medication refill  All current medications refilled x 6 months.

## 2012-08-29 ENCOUNTER — Other Ambulatory Visit: Payer: Self-pay | Admitting: Family Medicine

## 2012-09-04 ENCOUNTER — Other Ambulatory Visit: Payer: Self-pay | Admitting: Family Medicine

## 2012-09-19 ENCOUNTER — Other Ambulatory Visit: Payer: Self-pay | Admitting: Family Medicine

## 2012-10-02 ENCOUNTER — Other Ambulatory Visit: Payer: Self-pay | Admitting: Physician Assistant

## 2012-10-17 ENCOUNTER — Encounter: Payer: Medicare Other | Admitting: Family Medicine

## 2012-10-18 ENCOUNTER — Other Ambulatory Visit: Payer: Self-pay | Admitting: Family Medicine

## 2012-10-22 ENCOUNTER — Other Ambulatory Visit: Payer: Self-pay | Admitting: Family Medicine

## 2012-10-24 ENCOUNTER — Other Ambulatory Visit: Payer: Self-pay | Admitting: Physician Assistant

## 2012-10-29 ENCOUNTER — Ambulatory Visit (INDEPENDENT_AMBULATORY_CARE_PROVIDER_SITE_OTHER): Payer: Medicare Other | Admitting: Family Medicine

## 2012-10-29 ENCOUNTER — Encounter: Payer: Self-pay | Admitting: Family Medicine

## 2012-10-29 VITALS — BP 105/68 | HR 67 | Temp 98.2°F | Resp 16 | Ht 67.0 in | Wt 159.0 lb

## 2012-10-29 DIAGNOSIS — Z23 Encounter for immunization: Secondary | ICD-10-CM

## 2012-10-29 DIAGNOSIS — E119 Type 2 diabetes mellitus without complications: Secondary | ICD-10-CM

## 2012-10-29 DIAGNOSIS — Z760 Encounter for issue of repeat prescription: Secondary | ICD-10-CM

## 2012-10-29 MED ORDER — METFORMIN HCL 500 MG PO TABS: 500.0000 mg | ORAL_TABLET | Freq: Every day | ORAL | Status: DC

## 2012-10-29 MED ORDER — DULOXETINE HCL 60 MG PO CPEP: 60.0000 mg | ORAL_CAPSULE | Freq: Every day | ORAL | Status: DC

## 2012-10-29 MED ORDER — LISINOPRIL 5 MG PO TABS: 5.0000 mg | ORAL_TABLET | Freq: Every day | ORAL | Status: DC

## 2012-10-29 MED ORDER — COLESEVELAM HCL 3.75 G PO PACK: PACK | ORAL | Status: DC

## 2012-10-29 MED ORDER — SIMVASTATIN 80 MG PO TABS: 80.0000 mg | ORAL_TABLET | Freq: Every day | ORAL | Status: DC

## 2012-10-31 NOTE — Progress Notes (Signed)
S:  This 52 y.o. Cauc male has well controlled HTN and Type II dm. He is compliant with medications; there are no adverse effects. Pt has increased stress in home with mother and brother livng w/ him. His brother came to live w/ him after being released from prison; he will be leaving the home next week as he has plea-bargained and will return to prison. Pt's mother is in failing health (bed-ridden per pt) and he tries to care for her as best he can,given his physical limitations. He realizes that this stress is not good for him so he is finding healthy ways to cope. His girlfriend lives in the home and helps w/ mother's care also.  PMHx, Soc Hx and medications reviewed and reconciled.  ROS: As per HPI; negative for fatigue, diaphoresis, appetite change, CP or tightness, palpitations, edema, GI problems, HA, dizziness, new unilateral numbness or weakness, facial  asym or slurred speech or syncope. No agitation, behavior changes, dysphoric mood, thoughts of self-harm, SI/HI.  O: Filed Vitals:   10/29/12 1658  BP: 105/68  Pulse: 67  Temp: 98.2 F (36.8 C)  Resp: 16   GEN: In NAD; WN,WD. HENT: [place]/AT; EOMI w/ clear conj/ sclerae. Otherwise unremarkable. COR: RRR. LUNGS: Normal resp rate and effort. NEURO: A&O x 3; CNs intact except mild dysarthria. L-sided hemiparesis at baseline. Pt ambulates w/ a cane.  Results for orders placed in visit on 02/16/12  BASIC METABOLIC PANEL      Result Value Range   Sodium 140  135 - 145 mEq/L   Potassium 4.5  3.5 - 5.3 mEq/L   Chloride 103  96 - 112 mEq/L   CO2 30  19 - 32 mEq/L   Glucose, Bld 115 (*) 70 - 99 mg/dL   BUN 9  6 - 23 mg/dL   Creat 1.61  0.96 - 0.45 mg/dL   Calcium 40.9  8.4 - 81.1 mg/dL  LDL CHOLESTEROL, DIRECT      Result Value Range   Direct LDL 68    POCT GLYCOSYLATED HEMOGLOBIN (HGB A1C)      Result Value Range   Hemoglobin A1C 5.9     A/P: Type II diabetes mellitus- Stable; pt reports no significant change in nutrition or  activity.  No change in medications.  Issue of repeat prescriptions  Meds ordered this encounter  Medications  . DULoxetine (CYMBALTA) 60 MG capsule    Sig: Take 1 capsule (60 mg total) by mouth daily. Needs office visit    Dispense:  30 capsule    Refill:  5  . lisinopril (PRINIVIL,ZESTRIL) 5 MG tablet    Sig: Take 1 tablet (5 mg total) by mouth daily.    Dispense:  90 tablet    Refill:  1  . metFORMIN (GLUCOPHAGE) 500 MG tablet    Sig: Take 1 tablet (500 mg total) by mouth daily with breakfast.    Dispense:  30 tablet    Refill:  5  . simvastatin (ZOCOR) 80 MG tablet    Sig: Take 1 tablet (80 mg total) by mouth at bedtime.    Dispense:  30 tablet    Refill:  5  . Colesevelam HCl (WELCHOL) 3.75 G PACK    Sig: TAKE 1 DOSE EVERY MORNING .    Dispense:  30 packet    Refill:  5

## 2012-11-27 ENCOUNTER — Encounter: Payer: Self-pay | Admitting: Family Medicine

## 2013-01-29 ENCOUNTER — Telehealth: Payer: Self-pay

## 2013-01-29 DIAGNOSIS — B1920 Unspecified viral hepatitis C without hepatic coma: Secondary | ICD-10-CM

## 2013-01-29 NOTE — Telephone Encounter (Signed)
Pt would like a referral to [facility] (liver disease and transplant hepatology) [address] phone # [phone]  Pt phone # is h) [phone]  C) [phone]

## 2013-01-30 NOTE — Telephone Encounter (Signed)
Referral done per pt request

## 2013-01-31 ENCOUNTER — Telehealth: Payer: Self-pay | Admitting: Radiology

## 2013-01-31 NOTE — Telephone Encounter (Signed)
Letter provided for Alan Patel to make appt hopefully this will help him, Amy

## 2013-01-31 NOTE — Telephone Encounter (Signed)
Please compose a letter stating that, per pt report, he was diagnosed w/ Hepatitis C in the 1990s. We have no record of this nor have we performed any lab tests to determine viral load or genotype. Fortunately his LFTs are normal. Pt has a hx of substance abuse but practices abstinence and manages his other chronic medical problems very well. He is a compliant pt who does continue to smoke. He is s/p CVA in early 2012 and does have mild CNS deficits.  If you need anything else, just let me know. Thank you.

## 2013-01-31 NOTE — Telephone Encounter (Signed)
Can you help with a referral? Have gotten referral to Hep C clinic to send to [facility], but there is no documentation in the chart regarding this, can you write letter to them regarding referral? Or does he need to return to clinic. If you want to write letter but don't have the time I can write it for you, just call and let me know what to put in letter.

## 2013-02-10 ENCOUNTER — Telehealth: Payer: Self-pay | Admitting: Radiology

## 2013-02-10 DIAGNOSIS — Z8619 Personal history of other infectious and parasitic diseases: Secondary | ICD-10-CM

## 2013-02-10 NOTE — Telephone Encounter (Signed)
I spoke to the liver center, they require hepatitis panel, genotype, and viral load prior to appt scheduling, how would you like to proceed 1. Office visit with labs or 2. Lab only visit please advise.

## 2013-02-11 ENCOUNTER — Other Ambulatory Visit: Payer: Self-pay | Admitting: *Deleted

## 2013-02-11 ENCOUNTER — Other Ambulatory Visit: Payer: Self-pay | Admitting: Family Medicine

## 2013-02-11 DIAGNOSIS — Z8619 Personal history of other infectious and parasitic diseases: Secondary | ICD-10-CM

## 2013-02-11 NOTE — Telephone Encounter (Signed)
Lab only visit- I will place order for requested labs. Inform pt that he needs to come in for labs in January 2015. Thanks.

## 2013-02-11 NOTE — Telephone Encounter (Signed)
Spoke with patient he understands to come in January to have labs drawn so we can then schedule his appt to the liver center. I will put in aniother orders only encounter for Dr. Audria Nine because the labs she ordered didn't go through because they weren't ordered as future.

## 2013-02-17 ENCOUNTER — Other Ambulatory Visit: Payer: Self-pay | Admitting: Family Medicine

## 2013-02-21 ENCOUNTER — Other Ambulatory Visit (INDEPENDENT_AMBULATORY_CARE_PROVIDER_SITE_OTHER): Payer: Medicare Other

## 2013-02-21 DIAGNOSIS — Z8619 Personal history of other infectious and parasitic diseases: Secondary | ICD-10-CM

## 2013-02-22 LAB — HEPATITIS C ANTIBODY: HCV Ab: REACTIVE — AB

## 2013-02-22 LAB — HEPATITIS B CORE ANTIBODY, TOTAL: Hep B Core Total Ab: REACTIVE — AB

## 2013-02-22 LAB — HEPATITIS A ANTIBODY, TOTAL: HEP A TOTAL AB: NONREACTIVE

## 2013-02-22 LAB — HEPATITIS B SURFACE ANTIBODY,QUALITATIVE: HEP B S AB: POSITIVE — AB

## 2013-02-24 LAB — HEPATITIS C RNA QUANTITATIVE: HCV Quantitative: NOT DETECTED IU/mL (ref <15)

## 2013-02-24 LAB — HEPATITIS B E ANTIBODY: Hepatitis Be Antibody: NEGATIVE

## 2013-02-25 LAB — HEPATITIS C GENOTYPE

## 2013-02-27 NOTE — Progress Notes (Signed)
Quick Note:  Please advise pt regarding following labs... Antibody results are positive for Hepatitis B and Hepatitis C but the amount of Hepatitis C virus was not detectable.   These results can be forwarded to the Hepatitis specialist if you still want to be evaluated. Please notify the clinic about your decision.  Copy to pt. ______

## 2013-02-28 ENCOUNTER — Telehealth: Payer: Self-pay

## 2013-02-28 NOTE — Telephone Encounter (Signed)
Alan DossYes,they can view these, called but numbers given have been disconnected.

## 2013-02-28 NOTE — Telephone Encounter (Signed)
Patient wants to know if we sent his results to the liver doctor [phone]

## 2013-03-20 ENCOUNTER — Encounter: Payer: Self-pay | Admitting: Family Medicine

## 2013-03-20 ENCOUNTER — Ambulatory Visit (INDEPENDENT_AMBULATORY_CARE_PROVIDER_SITE_OTHER): Payer: Medicare Other | Admitting: Family Medicine

## 2013-03-20 VITALS — BP 140/73 | HR 84 | Temp 98.0°F | Resp 16 | Ht 67.0 in | Wt 177.0 lb

## 2013-03-20 DIAGNOSIS — B1920 Unspecified viral hepatitis C without hepatic coma: Secondary | ICD-10-CM

## 2013-03-20 DIAGNOSIS — Z0000 Encounter for general adult medical examination without abnormal findings: Secondary | ICD-10-CM

## 2013-03-20 DIAGNOSIS — N529 Male erectile dysfunction, unspecified: Secondary | ICD-10-CM

## 2013-03-20 DIAGNOSIS — E119 Type 2 diabetes mellitus without complications: Secondary | ICD-10-CM

## 2013-03-20 DIAGNOSIS — I10 Essential (primary) hypertension: Secondary | ICD-10-CM

## 2013-03-20 DIAGNOSIS — Z23 Encounter for immunization: Secondary | ICD-10-CM

## 2013-03-20 LAB — POCT GLYCOSYLATED HEMOGLOBIN (HGB A1C): HEMOGLOBIN A1C: 6.2

## 2013-03-20 LAB — LIPID PANEL
CHOL/HDL RATIO: 2.8 ratio
Cholesterol: 145 mg/dL (ref 0–200)
HDL: 52 mg/dL (ref >39)
LDL Cholesterol: 76 mg/dL (ref 0–99)
Triglycerides: 86 mg/dL (ref <150)
VLDL: 17 mg/dL (ref 0–40)

## 2013-03-20 LAB — PSA: PSA: 0.21 ng/mL (ref <=4.00)

## 2013-03-20 LAB — COMPLETE METABOLIC PANEL WITH GFR
ALBUMIN: 4.2 g/dL (ref 3.5–5.2)
ALK PHOS: 64 U/L (ref 39–117)
ALT: 18 U/L (ref 0–53)
AST: 17 U/L (ref 0–37)
BILIRUBIN TOTAL: 0.3 mg/dL (ref 0.2–1.2)
BUN: 7 mg/dL (ref 6–23)
CO2: 30 mEq/L (ref 19–32)
Calcium: 9.6 mg/dL (ref 8.4–10.5)
Chloride: 104 mEq/L (ref 96–112)
Creat: 0.64 mg/dL (ref 0.50–1.35)
GFR, Est African American: >89 mL/min
GFR, Est Non African American: >89 mL/min
Glucose, Bld: 126 mg/dL — ABNORMAL HIGH (ref 70–99)
POTASSIUM: 4.6 meq/L (ref 3.5–5.3)
SODIUM: 141 meq/L (ref 135–145)
TOTAL PROTEIN: 6.5 g/dL (ref 6.0–8.3)

## 2013-03-20 LAB — IFOBT (OCCULT BLOOD): IFOBT: POSITIVE

## 2013-03-20 MED ORDER — SILDENAFIL CITRATE 50 MG PO TABS: ORAL_TABLET | ORAL | Status: DC

## 2013-03-20 NOTE — Patient Instructions (Addendum)
Keeping you healthy  Get these tests  Blood pressure- Have your blood pressure checked once a year by your healthcare provider.  Normal blood pressure is 120/80  Weight- Have your body mass index (BMI) calculated to screen for obesity.  BMI is a measure of body fat based on height and weight. You can also calculate your own BMI at [email or web address]  Cholesterol- Have your cholesterol checked every year.  Diabetes- Have your blood sugar checked regularly if you have high blood pressure, high cholesterol, have a family history of diabetes or if you are overweight.  Screening for Colon Cancer- Colonoscopy starting at age 54.  Screening may begin sooner depending on your family history and other health conditions. Follow up colonoscopy as directed by your Gastroenterologist.  Screening for Prostate Cancer- Both blood work (PSA) and a rectal exam help screen for Prostate Cancer.  Screening begins at age 840 with African-American men and at age 54 with Caucasian men.  Screening may begin sooner depending on your family history.  Take these medicines  Aspirin- One aspirin daily can help prevent Heart disease and Stroke.  Flu shot- Every fall. You received Flu vaccine today.  Tetanus- Every 10 years. You received Tdap today. Next Tetanus due in 2025.  Zostavax- Once after the age of 54 to prevent Shingles.  Pneumonia shot- Once after the age of 54; if you are younger than 4665, ask your healthcare provider if you need a Pneumonia shot.  Take these steps  Don't smoke- If you do smoke, talk to your doctor about quitting.  For tips on how to quit, go to www.smokefree.gov or call 1-800-QUIT-NOW.  Be physically active- Exercise 5 days a week for at least 30 minutes.  If you are not already physically active start slow and gradually work up to 30 minutes of moderate physical activity.  Examples of moderate activity include walking briskly, mowing the yard, dancing, swimming, bicycling,  etc.  Eat a healthy diet- Eat a variety of healthy food such as fruits, vegetables, low fat milk, low fat cheese, yogurt, lean meant, poultry, fish, beans, tofu, etc. For more information go to www.thenutritionsource.org  Drink alcohol in moderation- Limit alcohol intake to less than two drinks a day. Never drink and drive.  Dentist- Brush and floss twice daily; visit your dentist twice a year.  Depression- Your emotional health is as important as your physical health. If you're feeling down, or losing interest in things you would normally enjoy please talk to your healthcare provider.  Eye exam- Visit your eye doctor every year.  Safe sex- If you may be exposed to a sexually transmitted infection, use a condom.  Seat belts- Seat belts can save your life; always wear one.  Smoke/Carbon Monoxide detectors- These detectors need to be installed on the appropriate level of your home.  Replace batteries at least once a year.  Skin cancer- When out in the sun, cover up and use sunscreen 15 SPF or higher.  Violence- If anyone is threatening you, please tell your healthcare provider.  Living Will/ Health care power of attorney- Speak with your healthcare provider and family.

## 2013-03-20 NOTE — Progress Notes (Signed)
Subjective:    Patient ID: Alan Patel, male    DOB: 11/23/1960, 53 y.o.   MRN: [id number]  HPI  This 53 y.o. Cauc male is here for CPE; he has Type II DM which has been well controlled on oral medication; no reported episodes of hypoglycemia. HTN is stable and well controlled w/o medication side effects. Hepatitis C antibody is positive but recent testing was unable to detect HCV RNA; pt has not been previously treated for this infection.  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 He does continue to smoke. He is in a stable monogamous heterosexual relationship.  HCM:  CRS- Needs referral; GI ref pending for Hepatitis evaluation.            IMM- ? Pneumovax (pt had aspiration pneumonia while in hosp in 2006).            Vision- Current.  Patient Active Problem List   Diagnosis Date Noted  . HTN (hypertension) 07/21/2011  . Type II diabetes mellitus 07/21/2011  . Tobacco user 07/21/2011  . Late effects of CVA (cerebrovascular accident) 07/21/2011  . Hepatitis C 07/21/2011  . History of substance abuse 07/21/2011    Prior to Admission medications   Medication Sig Start Date End Date Taking? Authorizing Provider  aspirin 81 MG tablet Take 81 mg by mouth daily.   Yes Historical Provider, MD  calcium-vitamin D (OSCAL WITH D) 500-200 MG-UNIT per tablet Take 1 tablet by mouth 2 (two) times daily.   Yes Historical Provider, MD  DULoxetine (CYMBALTA) 60 MG capsule Take 1 capsule (60 mg total) by mouth daily. Needs office visit 10/29/12  Yes Maurice MarchBarbara B Lucien Budney, MD  lisinopril (PRINIVIL,ZESTRIL) 5 MG tablet Take 1 tablet (5 mg total) by mouth daily. 10/29/12  Yes Maurice MarchBarbara B Johann Santone, MD  metFORMIN (GLUCOPHAGE) 500 MG tablet Take 1 tablet (500 mg total) by mouth daily with breakfast. 10/29/12  Yes Maurice MarchBarbara B Joal Eakle, MD  milk thistle 175 MG tablet Take 1,000 mg by mouth daily.    Yes Historical Provider, MD  simvastatin (ZOCOR) 80 MG tablet Take 1  tablet (80 mg total) by mouth at bedtime. 10/29/12  Yes Maurice MarchBarbara B Rini Moffit, MD  vitamin B-12 (CYANOCOBALAMIN) 1000 MCG tablet Take by mouth daily.   Yes Historical Provider, MD  sildenafil (VIAGRA) 50 MG tablet Take 1/2 - 1 tablet by mouth 1-4 hours prior to sexual activity. 03/20/13   Maurice MarchBarbara B Shlok Raz, MD    PMHx, Surg Hx, Soc and Fam Hx reviewed.   Review of Systems  Constitutional: Negative.   Eyes: Negative.   Respiratory: Positive for cough. Negative for chest tightness, shortness of breath and wheezing.   Cardiovascular: Negative.   Gastrointestinal: Negative.   Endocrine: Negative.   Genitourinary: Negative.        Erectile dysfunction.  Musculoskeletal: Positive for arthralgias.  Skin: Negative.   Allergic/Immunologic: Negative.   Neurological: Positive for dizziness and headaches.  Hematological: Negative.   Psychiatric/Behavioral: Negative for behavioral problems, confusion, sleep disturbance, self-injury, dysphoric mood, decreased concentration and agitation. The patient is nervous/anxious.       Objective:   Physical Exam  Nursing note and vitals reviewed. Constitutional: He is oriented to person, place, and time. Vital signs are normal. He appears well-developed and well-nourished. No distress.  HENT:  Head: Normocephalic and atraumatic.  Right Ear: Hearing, tympanic membrane, external ear and ear canal normal.  Left Ear: Hearing, tympanic membrane, external ear and ear canal normal.  Nose: Nose normal. No nasal deformity or septal deviation.  Mouth/Throat: Uvula is midline and mucous membranes are normal. No oral lesions. Abnormal dentition. No dental caries. Posterior oropharyngeal erythema present.  Eyes: Conjunctivae, EOM and lids are normal. Pupils are equal, round, and reactive to light. No scleral icterus.  Fundoscopic exam:      The right eye shows no papilledema. The right eye shows red reflex.       The left eye shows no papilledema. The left eye shows  red reflex.  Neck: Trachea normal and full passive range of motion without pain. Neck supple. No JVD present. No spinous process tenderness and no muscular tenderness present. Carotid bruit is not present. No mass and no thyromegaly present.  Cardiovascular: Normal rate, regular rhythm, S1 normal, S2 normal, normal heart sounds, intact distal pulses and normal pulses.   No extrasystoles are present. PMI is not displaced.  Exam reveals no gallop and no friction rub.   No murmur heard. Pulmonary/Chest: Effort normal and breath sounds normal. No respiratory distress. He has no wheezes. He has no rhonchi.  Decreased BS at bases.  Abdominal: Soft. Normal appearance and bowel sounds are normal. He exhibits no distension, no pulsatile midline mass and no mass. There is no hepatosplenomegaly. There is no tenderness. There is no guarding and no CVA tenderness. No hernia.  Genitourinary: Prostate normal. Rectal exam shows internal hemorrhoid. Rectal exam shows no external hemorrhoid, no fissure, no mass, no tenderness and anal tone normal. Guaiac positive stool.  NMEG; testicular exam not performed.  Musculoskeletal: Normal range of motion. He exhibits no edema and no tenderness.  Lymphadenopathy:       Head (right side): No submental, no submandibular, no tonsillar, no preauricular, no posterior auricular and no occipital adenopathy present.       Head (left side): No submental, no submandibular, no tonsillar, no preauricular, no posterior auricular and no occipital adenopathy present.    He has no cervical adenopathy.    He has no axillary adenopathy.       Right: No inguinal and no supraclavicular adenopathy present.       Left: No inguinal and no supraclavicular adenopathy present.  Neurological: He is alert and oriented to person, place, and time. He displays tremor and abnormal reflex. He displays no atrophy. A cranial nerve deficit is present. No sensory deficit. He exhibits abnormal muscle tone.  Coordination and gait abnormal.  Mild dysarthria and facial weakness. Mild hemiparesis and spasticity in upper and lower ext. Abnormal findings c/w brainstem CVA- 2006.  Skin: Skin is warm and intact. No ecchymosis, no lesion and no rash noted. He is not diaphoretic. No cyanosis or erythema. No pallor. Nails show clubbing.  Psychiatric: He has a normal mood and affect. His behavior is normal. Judgment and thought content normal. His speech is delayed. Cognition and memory are normal.  Speech- mild dysathria    Results for orders placed in visit on 03/20/13  IFOBT (OCCULT BLOOD)      Result Value Range   IFOBT Positive    POCT GLYCOSYLATED HEMOGLOBIN (HGB A1C)      Result Value Range   Hemoglobin A1C 6.2     ECG: NSR; no ST-TW changes. No ectopy.     Assessment & Plan:  Routine general medical examination at a health care facility - Plan: IFOBT POC (occult bld, rslt in office), COMPLETE METABOLIC PANEL WITH GFR, Lipid panel, PSA  Type II or unspecified type diabetes mellitus without mention of complication,  not stated as uncontrolled - Controlled on current medications and nutrition.  Plan: [facility] Diabetes Foot Exam, EKG 12-Lead, POCT glycosylated hemoglobin (Hb A1C)  HTN (hypertension) - Stable and controlled; no medication changes at this time. Plan: EKG 12-Lead  Erectile dysfunction- Trial of Viagra 50 mg 1/2 -1 tablet once a day prn prior to sexual activity. Pt advised about side effects and cautioned not to repeat dosing if adverse effects occur. He voiced understanding.  Need for prophylactic vaccination and inoculation against influenza - Plan: Flu Vaccine QUAD 36+ mos IM  Need for Tdap vaccination - Plan: Tdap vaccine greater than or equal to 7yo IM  Consider Pneumovax at next appt. Will check records for prior vaccination if pt unclear about vaccination hx.   Meds ordered this encounter  Medications  . sildenafil (VIAGRA) 50 MG tablet    Sig: Take 1/2 - 1 tablet by mouth  1-4 hours prior to sexual activity.    Dispense:  5 tablet    Refill:  1

## 2013-03-21 NOTE — Progress Notes (Signed)
Quick Note:  Please notify pt that results are normal.   Provide pt with copy of labs. ______ 

## 2013-03-22 ENCOUNTER — Encounter: Payer: Self-pay | Admitting: Family Medicine

## 2013-04-19 ENCOUNTER — Other Ambulatory Visit: Payer: Self-pay | Admitting: Family Medicine

## 2013-05-03 ENCOUNTER — Other Ambulatory Visit: Payer: Self-pay | Admitting: Family Medicine

## 2013-05-14 ENCOUNTER — Other Ambulatory Visit: Payer: Self-pay | Admitting: Family Medicine

## 2013-05-31 ENCOUNTER — Other Ambulatory Visit: Payer: Self-pay | Admitting: Family Medicine

## 2013-06-11 ENCOUNTER — Telehealth: Payer: Self-pay | Admitting: *Deleted

## 2013-06-11 NOTE — Telephone Encounter (Signed)
Called patient to pick up signed/completed handicap parking placard form at [id number]. The application was picked by patient 10 minutes after calling him.

## 2013-06-20 ENCOUNTER — Other Ambulatory Visit: Payer: Self-pay | Admitting: Family Medicine

## 2013-06-22 ENCOUNTER — Other Ambulatory Visit: Payer: Self-pay | Admitting: Family Medicine

## 2013-07-08 ENCOUNTER — Encounter: Payer: Self-pay | Admitting: Family Medicine

## 2013-08-19 ENCOUNTER — Other Ambulatory Visit: Payer: Self-pay | Admitting: Family Medicine

## 2013-08-21 ENCOUNTER — Ambulatory Visit: Payer: Medicare Other | Admitting: Family Medicine

## 2013-09-16 ENCOUNTER — Other Ambulatory Visit: Payer: Self-pay | Admitting: Family Medicine

## 2013-09-17 ENCOUNTER — Other Ambulatory Visit: Payer: Self-pay | Admitting: Physician Assistant

## 2013-09-17 ENCOUNTER — Other Ambulatory Visit: Payer: Self-pay | Admitting: Family Medicine

## 2013-09-24 ENCOUNTER — Ambulatory Visit: Payer: Medicare Other | Admitting: Family Medicine

## 2013-10-13 ENCOUNTER — Other Ambulatory Visit: Payer: Self-pay | Admitting: Family Medicine

## 2013-10-30 ENCOUNTER — Ambulatory Visit: Payer: Medicare Other | Admitting: Family Medicine

## 2013-11-01 ENCOUNTER — Other Ambulatory Visit: Payer: Self-pay | Admitting: Family Medicine

## 2013-11-03 NOTE — Telephone Encounter (Signed)
Pt has not been seen since 02/2013. He has appt sch w/you but not until Nov. Do you want to OK RFs until then?

## 2013-11-05 NOTE — Telephone Encounter (Signed)
Metformin refills authorized until November visit.

## 2013-11-18 ENCOUNTER — Other Ambulatory Visit: Payer: Self-pay | Admitting: Physician Assistant

## 2013-11-18 NOTE — Telephone Encounter (Signed)
Cymbalta refill authorized until OV in Nov 2015.

## 2013-11-18 NOTE — Telephone Encounter (Signed)
Dr Audria NineMcPherson, do you want to RF until pt's appt on 01/06/14? Pended.

## 2013-11-24 ENCOUNTER — Other Ambulatory Visit: Payer: Self-pay | Admitting: Family Medicine

## 2013-11-24 ENCOUNTER — Telehealth: Payer: Self-pay

## 2013-11-24 NOTE — Telephone Encounter (Signed)
Advised pt to contact pharmcy-pt has and they are sending in the refill request.

## 2013-11-24 NOTE — Telephone Encounter (Signed)
Patient request on refill on the following medications: Lisinopril 5 MG, Metformin hcl 500 MG, Simvastatin 80 MG, Duloxetine HCL 60 MG, and Welchol 3.75. [facility] on [address] [phone]

## 2013-12-16 ENCOUNTER — Encounter: Payer: Medicare Other | Admitting: Family Medicine

## 2013-12-20 ENCOUNTER — Other Ambulatory Visit: Payer: Self-pay | Admitting: Physician Assistant

## 2013-12-22 ENCOUNTER — Other Ambulatory Visit: Payer: Self-pay | Admitting: Radiology

## 2013-12-22 MED ORDER — COLESEVELAM HCL 3.75 G PO PACK: PACK | ORAL | Status: DC

## 2014-01-06 ENCOUNTER — Ambulatory Visit (INDEPENDENT_AMBULATORY_CARE_PROVIDER_SITE_OTHER): Payer: Commercial Managed Care - HMO | Admitting: Family Medicine

## 2014-01-06 ENCOUNTER — Encounter: Payer: Self-pay | Admitting: Family Medicine

## 2014-01-06 VITALS — BP 110/62 | HR 82 | Temp 98.3°F | Resp 16 | Ht 66.5 in | Wt 170.0 lb

## 2014-01-06 DIAGNOSIS — Z23 Encounter for immunization: Secondary | ICD-10-CM

## 2014-01-06 DIAGNOSIS — Z760 Encounter for issue of repeat prescription: Secondary | ICD-10-CM

## 2014-01-06 DIAGNOSIS — E119 Type 2 diabetes mellitus without complications: Secondary | ICD-10-CM

## 2014-01-06 DIAGNOSIS — H547 Unspecified visual loss: Secondary | ICD-10-CM

## 2014-01-06 LAB — CBC WITH DIFFERENTIAL/PLATELET
Basophils Absolute: 0 10*3/uL (ref 0.0–0.1)
Basophils Relative: 0 % (ref 0–1)
EOS ABS: 0.2 10*3/uL (ref 0.0–0.7)
Eosinophils Relative: 2 % (ref 0–5)
HEMATOCRIT: 45.2 % (ref 39.0–52.0)
HEMOGLOBIN: 15.4 g/dL (ref 13.0–17.0)
LYMPHS ABS: 1.5 10*3/uL (ref 0.7–4.0)
LYMPHS PCT: 20 % (ref 12–46)
MCH: 30.3 pg (ref 26.0–34.0)
MCHC: 34.1 g/dL (ref 30.0–36.0)
MCV: 89 fL (ref 78.0–100.0)
MPV: 10 fL (ref 9.4–12.4)
Monocytes Absolute: 0.6 10*3/uL (ref 0.1–1.0)
Monocytes Relative: 8 % (ref 3–12)
Neutro Abs: 5.4 10*3/uL (ref 1.7–7.7)
Neutrophils Relative %: 70 % (ref 43–77)
Platelets: 294 10*3/uL (ref 150–400)
RBC: 5.08 MIL/uL (ref 4.22–5.81)
RDW: 13.6 % (ref 11.5–15.5)
WBC: 7.7 10*3/uL (ref 4.0–10.5)

## 2014-01-06 LAB — COMPLETE METABOLIC PANEL WITH GFR
ALT: 35 U/L (ref 0–53)
AST: 27 U/L (ref 0–37)
Albumin: 4.5 g/dL (ref 3.5–5.2)
Alkaline Phosphatase: 62 U/L (ref 39–117)
BILIRUBIN TOTAL: 0.5 mg/dL (ref 0.2–1.2)
BUN: 11 mg/dL (ref 6–23)
CO2: 31 meq/L (ref 19–32)
Calcium: 9.8 mg/dL (ref 8.4–10.5)
Chloride: 103 mEq/L (ref 96–112)
Creat: 0.68 mg/dL (ref 0.50–1.35)
GFR, Est Non African American: >89 mL/min
Glucose, Bld: 142 mg/dL — ABNORMAL HIGH (ref 70–99)
Potassium: 5.6 mEq/L — ABNORMAL HIGH (ref 3.5–5.3)
Sodium: 140 mEq/L (ref 135–145)
Total Protein: 6.9 g/dL (ref 6.0–8.3)

## 2014-01-06 LAB — LIPID PANEL
CHOL/HDL RATIO: 2.6 ratio
Cholesterol: 151 mg/dL (ref 0–200)
HDL: 58 mg/dL (ref >39)
LDL Cholesterol: 73 mg/dL (ref 0–99)
Triglycerides: 102 mg/dL (ref <150)
VLDL: 20 mg/dL (ref 0–40)

## 2014-01-06 LAB — POCT GLYCOSYLATED HEMOGLOBIN (HGB A1C): HEMOGLOBIN A1C: 6.5

## 2014-01-06 MED ORDER — METFORMIN HCL 500 MG PO TABS: 500.0000 mg | ORAL_TABLET | Freq: Every day | ORAL | Status: DC

## 2014-01-06 MED ORDER — LISINOPRIL 5 MG PO TABS: 5.0000 mg | ORAL_TABLET | Freq: Every day | ORAL | Status: DC

## 2014-01-06 MED ORDER — SIMVASTATIN 80 MG PO TABS: 80.0000 mg | ORAL_TABLET | Freq: Every day | ORAL | Status: DC

## 2014-01-06 MED ORDER — DULOXETINE HCL 60 MG PO CPEP: 60.0000 mg | ORAL_CAPSULE | Freq: Every day | ORAL | Status: DC

## 2014-01-06 NOTE — Progress Notes (Signed)
S:  This 53 y.o. Cauc male has HTN, Type II DM and has residua of remote CVA. He is compliant w/ medications w/o adverse effects, [facility] [employer], follows a meal plan and avoids concentrated sweets. He is positive for Hep C antibody; evaluation with specialist requires not further follow-up. He needs a referral for DM vision evaluation; he states his vision is worsening.  Patient Active Problem List   Diagnosis Date Noted  . HTN (hypertension) 07/21/2011  . Type II diabetes mellitus 07/21/2011  . Tobacco user 07/21/2011  . Late effects of CVA (cerebrovascular accident) 07/21/2011  . Hepatitis C 07/21/2011  . History of substance abuse 07/21/2011    Prior to Admission medications   Medication Sig Start Date End Date Taking? Authorizing Provider  aspirin 81 MG tablet Take 81 mg by mouth daily.   Yes Historical Provider, MD  calcium-vitamin D (OSCAL WITH D) 500-200 MG-UNIT per tablet Take 1 tablet by mouth 2 (two) times daily.   Yes Historical Provider, MD  Colesevelam HCl [facility](WELCHOL) 3.75 G PACK TAKE 1 DOSE EVERY MORNING . 12/22/13  Yes Chelle S Jeffery, PA-C  DULoxetine (CYMBALTA) 60 MG capsule Take 1 capsule (60 mg total) by mouth daily.   Yes Maurice MarchBarbara B Emilyrose Darrah, MD  lisinopril (PRINIVIL,ZESTRIL) 5 MG tablet Take 1 tablet (5 mg total) by mouth daily.   Yes Maurice MarchBarbara B Truly Stankiewicz, MD  metFORMIN (GLUCOPHAGE) 500 MG tablet Take 1 tablet (500 mg total) by mouth daily.   Yes Maurice MarchBarbara B Kaelon Weekes, MD  simvastatin (ZOCOR) 80 MG tablet Take 1 tablet (80 mg total) by mouth daily.   Yes Maurice MarchBarbara B Bradyn Soward, MD  vitamin B-12 (CYANOCOBALAMIN) 1000 MCG tablet Take by mouth daily.   Yes Historical Provider, MD  sildenafil (VIAGRA) 50 MG tablet Take 1/2 - 1 tablet by mouth 1-4 hours prior to sexual activity. 03/20/13   Maurice MarchBarbara B Pelagia Iacobucci, MD    SOC and FAM Hx reviewed.  ROS: Negative for abnormal weight loss, diaphoresis, fatigue, CP or tightness, palpitations, SOB or DOE, cough, GI upset,  myalgias/ arthralgias, rashes or skin changes, HA, dizziness, numbness, weakness or syncope.   O: Filed Vitals:   01/06/14 1359  BP: 110/62  Pulse: 82  Temp: 98.3 F (36.8 C)  Resp: 16   GEN: In NAD; WN,WD. Weight down 7 lbs since Jan 2015. HENT: [place]/AT; EOMI w/ clear conj/sclerae. Otherwise unremarkable. COR: RRR. LUNGS: Unlabored resp. SKIN: W&D; no diaphoresis or erythema. NEURO: A&O x 3; CNs intact. Gait/ motor function at baseline. See DM Foot exam. Nonfocal.  A/P: Diabetes mellitus without complication - Stable w/ A1c ~ 6% for 2+ years. Plan: HM Diabetes Foot Exam, Ambulatory referral to Ophthalmology, POCT glycosylated hemoglobin (Hb A1C), COMPLETE METABOLIC PANEL WITH GFR, CBC with Differential, Lipid panel  Vision impairment - Plan: Ambulatory referral to Ophthalmology  Encounter for medication refill  Flu vaccine need - Plan: Flu Vaccine QUAD 36+ mos IM   Meds ordered this encounter  Medications  . DULoxetine (CYMBALTA) 60 MG capsule    Sig: Take 1 capsule (60 mg total) by mouth daily.    Dispense:  90 capsule    Refill:  1  . lisinopril (PRINIVIL,ZESTRIL) 5 MG tablet    Sig: Take 1 tablet (5 mg total) by mouth daily.    Dispense:  90 tablet    Refill:  1  . metFORMIN (GLUCOPHAGE) 500 MG tablet    Sig: Take 1 tablet (500 mg total) by mouth daily.  Dispense:  90 tablet    Refill:  1  . simvastatin (ZOCOR) 80 MG tablet    Sig: Take 1 tablet (80 mg total) by mouth daily.    Dispense:  90 tablet    Refill:  1

## 2014-01-18 ENCOUNTER — Other Ambulatory Visit: Payer: Self-pay | Admitting: Physician Assistant

## 2014-02-12 ENCOUNTER — Emergency Department (HOSPITAL_COMMUNITY): Payer: Commercial Managed Care - HMO

## 2014-02-12 ENCOUNTER — Emergency Department (HOSPITAL_COMMUNITY)
Admission: EM | Admit: 2014-02-12 | Discharge: 2014-02-12 | Payer: Commercial Managed Care - HMO | Attending: Emergency Medicine | Admitting: Emergency Medicine

## 2014-02-12 ENCOUNTER — Encounter (HOSPITAL_COMMUNITY): Payer: Self-pay | Admitting: Emergency Medicine

## 2014-02-12 DIAGNOSIS — F329 Major depressive disorder, single episode, unspecified: Secondary | ICD-10-CM | POA: Insufficient documentation

## 2014-02-12 DIAGNOSIS — W19XXXA Unspecified fall, initial encounter: Secondary | ICD-10-CM

## 2014-02-12 DIAGNOSIS — W1839XA Other fall on same level, initial encounter: Secondary | ICD-10-CM | POA: Diagnosis not present

## 2014-02-12 DIAGNOSIS — S0181XA Laceration without foreign body of other part of head, initial encounter: Secondary | ICD-10-CM | POA: Insufficient documentation

## 2014-02-12 DIAGNOSIS — E119 Type 2 diabetes mellitus without complications: Secondary | ICD-10-CM | POA: Diagnosis not present

## 2014-02-12 DIAGNOSIS — Z8673 Personal history of transient ischemic attack (TIA), and cerebral infarction without residual deficits: Secondary | ICD-10-CM | POA: Diagnosis not present

## 2014-02-12 DIAGNOSIS — Y92149 Unspecified place in prison as the place of occurrence of the external cause: Secondary | ICD-10-CM | POA: Insufficient documentation

## 2014-02-12 DIAGNOSIS — S0083XA Contusion of other part of head, initial encounter: Secondary | ICD-10-CM

## 2014-02-12 DIAGNOSIS — Y9389 Activity, other specified: Secondary | ICD-10-CM | POA: Insufficient documentation

## 2014-02-12 DIAGNOSIS — Z79899 Other long term (current) drug therapy: Secondary | ICD-10-CM | POA: Insufficient documentation

## 2014-02-12 DIAGNOSIS — I10 Essential (primary) hypertension: Secondary | ICD-10-CM | POA: Diagnosis not present

## 2014-02-12 DIAGNOSIS — Z7982 Long term (current) use of aspirin: Secondary | ICD-10-CM | POA: Insufficient documentation

## 2014-02-12 DIAGNOSIS — Y998 Other external cause status: Secondary | ICD-10-CM | POA: Insufficient documentation

## 2014-02-12 DIAGNOSIS — S0990XA Unspecified injury of head, initial encounter: Secondary | ICD-10-CM | POA: Diagnosis present

## 2014-02-12 MED ORDER — TRAMADOL HCL 50 MG PO TABS
50.0000 mg | ORAL_TABLET | Freq: Once | ORAL | Status: AC
  Administered 2014-02-12: 50 mg via ORAL
  Filled 2014-02-12: qty 1

## 2014-02-12 NOTE — ED Notes (Signed)
[facility]. [facility] states that he was put in leg chains and he tripped and fell. [facility]

## 2014-02-12 NOTE — Discharge Instructions (Signed)
Keep area clean.  Steristrips will fall off on their own.  Watch for signs of infection:  Redness, swelling or drainage of pus.  Expect to have bruising to your left eye.   Cryotherapy Cryotherapy is when you put ice on your injury. Ice helps lessen pain and puffiness (swelling) after an injury. Ice works the best when you start using it in the first 24 to 48 hours after an injury. HOME CARE  Put a dry or damp towel between the ice pack and your skin.  You may press gently on the ice pack.  Leave the ice on for no more than 10 to 20 minutes at a time.  Check your skin after 5 minutes to make sure your skin is okay.  Rest at least 20 minutes between ice pack uses.  Stop using ice when your skin loses feeling (numbness).  Do not use ice on someone who cannot tell you when it hurts. This includes small children and people with memory problems (dementia). GET HELP RIGHT AWAY IF:  You have white spots on your skin.  Your skin turns blue or pale.  Your skin feels waxy or hard.  Your puffiness gets worse. MAKE SURE YOU:   Understand these instructions.  Will watch your condition.  Will get help right away if you are not doing well or get worse. Document Released: 07/26/2007 Document Revised: 05/01/2011 Document Reviewed: 09/29/2010 [facility] Patient Information 2015 [place], [place]. This information is not intended to replace advice given to you by your health care provider. Make sure you discuss any questions you have with your health care provider.  Facial or Scalp Contusion A facial or scalp contusion is a deep bruise on the face or head. Injuries to the face and head generally cause a lot of swelling, especially around the eyes. Contusions are the result of an injury that caused bleeding under the skin. The contusion may turn blue, purple, or yellow. Minor injuries will give you a painless contusion, but more severe contusions may stay painful and swollen for a few weeks.  CAUSES   A facial or scalp contusion is caused by a blunt injury or trauma to the face or head area.  SIGNS AND SYMPTOMS   Swelling of the injured area.   Discoloration of the injured area.   Tenderness, soreness, or pain in the injured area.  DIAGNOSIS  The diagnosis can be made by taking a medical history and doing a physical exam. An X-ray exam, CT scan, or MRI may be needed to determine if there are any associated injuries, such as broken bones (fractures). TREATMENT  Often, the best treatment for a facial or scalp contusion is applying cold compresses to the injured area. Over-the-counter medicines may also be recommended for pain control.  HOME CARE INSTRUCTIONS   Only take over-the-counter or prescription medicines as directed by your health care provider.   Apply ice to the injured area.   Put ice in a plastic bag.   Place a towel between your skin and the bag.   Leave the ice on for 20 minutes, 2-3 times a day.  SEEK MEDICAL CARE IF:  You have bite problems.   You have pain with chewing.   You are concerned about facial defects. SEEK IMMEDIATE MEDICAL CARE IF:  You have severe pain or a headache that is not relieved by medicine.   You have unusual sleepiness, confusion, or personality changes.   You throw up (vomit).   You have a persistent  nosebleed.   You have double vision or blurred vision.   You have fluid drainage from your nose or ear.   You have difficulty walking or using your arms or legs.  MAKE SURE YOU:   Understand these instructions.  Will watch your condition.  Will get help right away if you are not doing well or get worse. Document Released: 03/16/2004 Document Revised: 11/27/2012 Document Reviewed: 09/19/2012 [facility] Patient Information 2015 [place], [place]. This information is not intended to replace advice given to you by your health care provider. Make sure you discuss any questions you have with your health care  provider.  Sterile Tape Wound Care Some cuts and wounds can be closed using sterile tape, also called skin adhesive strips. Skin adhesive strips can be used for shallow (superficial) and simple cuts, wounds, lacerations, and surgical incisions. These strips act in place of stitches to hold the edges of the wound together, allowing for faster healing. Unlike stitches, the adhesive strips do not require needles or anesthetic medicine for placement. The strips will wear off naturally as the wound is healing. It is important to take proper care of your wound at home while it heals.  HOME CARE INSTRUCTIONS  Try to keep the area around your wound clean and dry. Do not allow the adhesive strips to get wet for the first 12 hours.   Do not use any soaps or ointments on the wound for the first 12 hours.   If a bandage (dressing) has been applied, follow your health care provider's instructions for how often to change the dressing. Keep the dressing dry if one has been applied.   Do not remove the adhesive strips. They will fall off on their own. If they do not, you may remove them gently after 10 days. You should gently wet the strips before removing them. For example, this can be done in the shower.  Do not scratch, pick, or rub the wound area.   Protect the wound from further injury until it is healed.   Protect the wound from sun and tanning bed exposure while it is healing and for several weeks after healing.   Only take over-the-counter or prescription medicines as directed by your health care provider.   Keep all follow-up appointments as directed by your health care provider.  SEEK MEDICAL CARE IF: Your adhesive strips become wet or soaked with blood before the wound has healed. The tape will need to be replaced.  SEEK IMMEDIATE MEDICAL CARE IF:  You have increasing pain in the wound.   You develop a rash after the strips are applied.  Your wound becomes red, swollen, hot, or  tender.   You have a red streak that goes away from the wound.   You have pus coming from the wound.   You have increased bleeding from the wound.  You notice a bad smell coming from the wound.   Your wound breaks open. MAKE SURE YOU:  Understand these instructions.  Will watch your condition.  Will get help right away if you are not doing well or get worse. Document Released: 03/16/2004 Document Revised: 11/27/2012 Document Reviewed: 08/28/2012 [facility] Patient Information 2015 [place]Exit[place], [place]LL[place]. This information is not intended to replace advice given to you by your health care provider. Make sure you discuss any questions you have with your health care provider.

## 2014-02-12 NOTE — ED Provider Notes (Signed)
CSN: [id number]     Arrival date & time 02/12/14  2035 History  This chart was scribe for Olivia Mackie, MD by Angelene Giovanni, ED Scribe. The patient was seen in room A07C/A07C and the patient's care was started at 3:35 AM.     Chief Complaint  Patient presents with  . Fall   The history is provided by the patient. No language interpreter was used.   HPI Comments: Alan Patel is a 53 y.o. male with a hx of a stroke and DM who presents to the Emergency Department status post fall that occurred in jail [facility]. He denies LOC but reports lightheadedness and dizziness. He also reports associated nausea. He states that his Tetanus shot is UTD and he reports NKDA. Pt was shackled at time of fall, and tripped.    Past Medical History  Diagnosis Date  . Diabetes mellitus 2012  . Hypertension 2010  . Depression   . Stroke 2006    Brainstem stroke   Past Surgical History  Procedure Laterality Date  . Knee surgery      Reconstructive surgery (?ACL)   Family History  Problem Relation Age of Onset  . Heart attack Father   . Cancer Paternal Grandmother   . COPD Mother    History  Substance Use Topics  . Smoking status: Current Every Day Smoker -- 0.50 packs/day for 40 years    Types: Cigarettes  . Smokeless tobacco: Not on file  . Alcohol Use: No    Review of Systems  Gastrointestinal: Positive for nausea.  Neurological: Positive for dizziness and light-headedness.      Allergies  Review of patient's allergies indicates no known allergies.  Home Medications   Prior to Admission medications   Medication Sig Start Date End Date Taking? Authorizing Provider  aspirin 81 MG tablet Take 81 mg by mouth daily.   Yes Historical Provider, MD  calcium-vitamin D (OSCAL WITH D) 500-200 MG-UNIT per tablet Take 1 tablet by mouth 2 (two) times daily.   Yes Historical Provider, MD  cholecalciferol (VITAMIN D) 1000 UNITS tablet Take 1,000 Units by mouth daily.   Yes Historical Provider,  MD  DULoxetine (CYMBALTA) 60 MG capsule Take 1 capsule (60 mg total) by mouth daily. 01/06/14  Yes Maurice March, MD  lisinopril (PRINIVIL,ZESTRIL) 5 MG tablet Take 1 tablet (5 mg total) by mouth daily. 01/06/14  Yes Maurice March, MD  metFORMIN (GLUCOPHAGE) 500 MG tablet Take 1 tablet (500 mg total) by mouth daily. 01/06/14  Yes Maurice March, MD  Potassium (POTASSIMIN PO) Take 1 tablet by mouth daily.   Yes Historical Provider, MD  sildenafil (VIAGRA) 50 MG tablet Take 1/2 - 1 tablet by mouth 1-4 hours prior to sexual activity. 03/20/13  Yes Maurice March, MD  simvastatin (ZOCOR) 80 MG tablet Take 1 tablet (80 mg total) by mouth daily. 01/06/14  Yes Maurice March, MD  vitamin B-12 (CYANOCOBALAMIN) 1000 MCG tablet Take by mouth daily.   Yes Historical Provider, MD  [facility] 3.75 G PACK MIX & TAKE 1 DOSE EVERY MORNING . 01/20/14  Yes Maurice March, MD   There were no vitals taken for this visit. Physical Exam  Constitutional: He is oriented to person, place, and time. He appears well-developed and well-nourished.  HENT:  Head: Normocephalic.  Right Ear: External ear normal.  Left Ear: External ear normal.  Nose: Nose normal.  Mouth/Throat: Oropharynx is clear and moist.  Patient has 1 cm shallow laceration  above left eye.  He has contusion over the left eyebrow  Eyes: Conjunctivae and EOM are normal. Pupils are equal, round, and reactive to light.  Neck: Normal range of motion. Neck supple. No JVD present. No tracheal deviation present. No thyromegaly present.  Cardiovascular: Normal rate, regular rhythm, normal heart sounds and intact distal pulses.  Exam reveals no gallop and no friction rub.   No murmur heard. Pulmonary/Chest: Effort normal and breath sounds normal. No stridor. No respiratory distress. He has no wheezes. He has no rales. He exhibits no tenderness.  Abdominal: Soft. Bowel sounds are normal. He exhibits no distension and no mass. There is no  tenderness. There is no rebound and no guarding.  Musculoskeletal: Normal range of motion. He exhibits no edema or tenderness.  Lymphadenopathy:    He has no cervical adenopathy.  Neurological: He is alert and oriented to person, place, and time. He displays normal reflexes. He exhibits normal muscle tone. Coordination normal.  Skin: Skin is warm and dry. No rash noted. No erythema. No pallor.  Psychiatric: He has a normal mood and affect. His behavior is normal. Judgment and thought content normal.  Nursing note and vitals reviewed.   ED Course  Procedures (including critical care time) DIAGNOSTIC STUDIES:  COORDINATION OF CARE: 3:41 AM- Pt advised of plan for treatment and pt agrees.    Labs Review Labs Reviewed - No data to display  Imaging Review No results found.   EKG Interpretation None     Results for orders placed or performed in visit on 01/06/14  COMPLETE METABOLIC PANEL WITH GFR  Result Value Ref Range   Sodium 140 135 - 145 mEq/L   Potassium 5.6 (H) 3.5 - 5.3 mEq/L   Chloride 103 96 - 112 mEq/L   CO2 31 19 - 32 mEq/L   Glucose, Bld 142 (H) 70 - 99 mg/dL   BUN 11 6 - 23 mg/dL   Creat 9.19 8.02 - 2.17 mg/dL   Total Bilirubin 0.5 0.2 - 1.2 mg/dL   Alkaline Phosphatase 62 39 - 117 U/L   AST 27 0 - 37 U/L   ALT 35 0 - 53 U/L   Total Protein 6.9 6.0 - 8.3 g/dL   Albumin 4.5 3.5 - 5.2 g/dL   Calcium 9.8 8.4 - 98.1 mg/dL   GFR, Est African American >89 mL/min   GFR, Est Non African American >89 mL/min  CBC with Differential  Result Value Ref Range   WBC 7.7 4.0 - 10.5 K/uL   RBC 5.08 4.22 - 5.81 MIL/uL   Hemoglobin 15.4 13.0 - 17.0 g/dL   HCT 02.5 48.6 - 28.2 %   MCV 89.0 78.0 - 100.0 fL   MCH 30.3 26.0 - 34.0 pg   MCHC 34.1 30.0 - 36.0 g/dL   RDW 41.7 53.0 - 10.4 %   Platelets 294 150 - 400 K/uL   MPV 10.0 9.4 - 12.4 fL   Neutrophils Relative % 70 43 - 77 %   Neutro Abs 5.4 1.7 - 7.7 K/uL   Lymphocytes Relative 20 12 - 46 %   Lymphs Abs 1.5 0.7 - 4.0  K/uL   Monocytes Relative 8 3 - 12 %   Monocytes Absolute 0.6 0.1 - 1.0 K/uL   Eosinophils Relative 2 0 - 5 %   Eosinophils Absolute 0.2 0.0 - 0.7 K/uL   Basophils Relative 0 0 - 1 %   Basophils Absolute 0.0 0.0 - 0.1 K/uL   Smear Review Criteria  for review not met   Lipid panel  Result Value Ref Range   Cholesterol 151 0 - 200 mg/dL   Triglycerides 250 <037 mg/dL   HDL 58 >04 mg/dL   Total CHOL/HDL Ratio 2.6 Ratio   VLDL 20 0 - 40 mg/dL   LDL Cholesterol 73 0 - 99 mg/dL  POCT glycosylated hemoglobin (Hb A1C)  Result Value Ref Range   Hemoglobin A1C 6.5    Ct Head Wo Contrast  02/12/2014   CLINICAL DATA:  Status post fall; hit head on floor. Large scalp hematoma above the left orbit. Concern for cervical spine injury. Initial encounter.  EXAM: CT HEAD WITHOUT CONTRAST  CT CERVICAL SPINE WITHOUT CONTRAST  TECHNIQUE: Multidetector CT imaging of the head and cervical spine was performed following the standard protocol without intravenous contrast. Multiplanar CT image reconstructions of the cervical spine were also generated.  COMPARISON:  CT of the head and cervical spine performed 12/28/2005  FINDINGS: CT HEAD FINDINGS  There is no evidence of acute infarction, mass lesion, or intra- or extra-axial hemorrhage on CT.  There is a chronic infarct involving the right cerebellar hemisphere, with associated encephalomalacia. A small chronic infarct is noted in the high right anterior parietal region.  The brainstem and fourth ventricle are within normal limits. The basal ganglia are unremarkable in appearance. No mass effect or midline shift is seen.  There is no evidence of fracture; visualized osseous structures are unremarkable in appearance. The orbits are within normal limits. Mucosal thickening is noted at the left maxillary sinus. The remaining paranasal sinuses and mastoid air cells are well-aerated. Prominent soft tissue swelling is noted overlying the left orbit.  CT CERVICAL SPINE FINDINGS   There is no evidence of fracture or subluxation. Vertebral bodies demonstrate normal height and alignment. Intervertebral disc spaces are preserved. Prevertebral soft tissues are within normal limits. The visualized neural foramina are grossly unremarkable.  The visualized portions of the thyroid gland are unremarkable in appearance. The visualized lung apices are clear. No significant soft tissue abnormalities are seen.  IMPRESSION: 1. No evidence of traumatic intracranial injury or fracture. 2. No evidence of fracture or subluxation along the cervical spine. 3. Prominent soft tissue swelling overlying the left orbit. 4. Chronic infarct involving the right cerebellar hemisphere, with associated encephalomalacia. Small chronic infarct in the high right anterior parietal region. 5. Mucosal thickening at the left maxillary sinus.   Electronically Signed   By: Roanna Raider M.D.   On: 02/12/2014 05:08   Ct Cervical Spine Wo Contrast  02/12/2014   CLINICAL DATA:  Status post fall; hit head on floor. Large scalp hematoma above the left orbit. Concern for cervical spine injury. Initial encounter.  EXAM: CT HEAD WITHOUT CONTRAST  CT CERVICAL SPINE WITHOUT CONTRAST  TECHNIQUE: Multidetector CT imaging of the head and cervical spine was performed following the standard protocol without intravenous contrast. Multiplanar CT image reconstructions of the cervical spine were also generated.  COMPARISON:  CT of the head and cervical spine performed 12/28/2005  FINDINGS: CT HEAD FINDINGS  There is no evidence of acute infarction, mass lesion, or intra- or extra-axial hemorrhage on CT.  There is a chronic infarct involving the right cerebellar hemisphere, with associated encephalomalacia. A small chronic infarct is noted in the high right anterior parietal region.  The brainstem and fourth ventricle are within normal limits. The basal ganglia are unremarkable in appearance. No mass effect or midline shift is seen.  There is no  evidence of fracture;  visualized osseous structures are unremarkable in appearance. The orbits are within normal limits. Mucosal thickening is noted at the left maxillary sinus. The remaining paranasal sinuses and mastoid air cells are well-aerated. Prominent soft tissue swelling is noted overlying the left orbit.  CT CERVICAL SPINE FINDINGS  There is no evidence of fracture or subluxation. Vertebral bodies demonstrate normal height and alignment. Intervertebral disc spaces are preserved. Prevertebral soft tissues are within normal limits. The visualized neural foramina are grossly unremarkable.  The visualized portions of the thyroid gland are unremarkable in appearance. The visualized lung apices are clear. No significant soft tissue abnormalities are seen.  IMPRESSION: 1. No evidence of traumatic intracranial injury or fracture. 2. No evidence of fracture or subluxation along the cervical spine. 3. Prominent soft tissue swelling overlying the left orbit. 4. Chronic infarct involving the right cerebellar hemisphere, with associated encephalomalacia. Small chronic infarct in the high right anterior parietal region. 5. Mucosal thickening at the left maxillary sinus.   Electronically Signed   By: Garald Balding M.D.   On: 02/12/2014 05:08      MDM   Final diagnoses:  Fall  Forehead contusion, initial encounter  Forehead laceration, initial encounter    53 year old male status post fall in the jail.  Wound closed with Steri-Strips.  CT scans ordered secondary to history of stroke and significant fall from standing height.  Unremarkable for new changes.  Stable for discharge back to jail.  I personally performed the services described in this documentation, which was scribed in my presence. The recorded information has been reviewed and is accurate.    Kalman Drape, MD 02/12/14 [phone]

## 2014-04-08 ENCOUNTER — Encounter: Payer: Commercial Managed Care - HMO | Admitting: Family Medicine

## 2014-04-21 ENCOUNTER — Encounter: Payer: Commercial Managed Care - HMO | Admitting: Family Medicine

## 2014-04-24 ENCOUNTER — Ambulatory Visit (INDEPENDENT_AMBULATORY_CARE_PROVIDER_SITE_OTHER): Payer: Commercial Managed Care - HMO | Admitting: Family Medicine

## 2014-04-24 ENCOUNTER — Encounter: Payer: Self-pay | Admitting: Family Medicine

## 2014-04-24 VITALS — BP 101/72 | HR 97 | Temp 98.6°F | Resp 18 | Ht 66.5 in | Wt 176.0 lb

## 2014-04-24 DIAGNOSIS — Z23 Encounter for immunization: Secondary | ICD-10-CM

## 2014-04-24 DIAGNOSIS — I10 Essential (primary) hypertension: Secondary | ICD-10-CM | POA: Diagnosis not present

## 2014-04-24 DIAGNOSIS — E119 Type 2 diabetes mellitus without complications: Secondary | ICD-10-CM | POA: Diagnosis not present

## 2014-04-24 LAB — BASIC METABOLIC PANEL
BUN: 13 mg/dL (ref 6–23)
CALCIUM: 9.8 mg/dL (ref 8.4–10.5)
CHLORIDE: 100 meq/L (ref 96–112)
CO2: 28 meq/L (ref 19–32)
CREATININE: 0.93 mg/dL (ref 0.50–1.35)
Glucose, Bld: 116 mg/dL — ABNORMAL HIGH (ref 70–99)
Potassium: 3.9 mEq/L (ref 3.5–5.3)
Sodium: 136 mEq/L (ref 135–145)

## 2014-04-24 LAB — MAGNESIUM: Magnesium: 1.9 mg/dL (ref 1.5–2.5)

## 2014-04-24 MED ORDER — DULOXETINE HCL 60 MG PO CPEP: 60.0000 mg | ORAL_CAPSULE | Freq: Every day | ORAL | Status: AC

## 2014-04-24 MED ORDER — METFORMIN HCL 500 MG PO TABS: 500.0000 mg | ORAL_TABLET | Freq: Every day | ORAL | Status: AC

## 2014-04-24 MED ORDER — LISINOPRIL 5 MG PO TABS: 5.0000 mg | ORAL_TABLET | Freq: Every day | ORAL | Status: AC

## 2014-04-24 MED ORDER — SIMVASTATIN 80 MG PO TABS: 80.0000 mg | ORAL_TABLET | Freq: Every day | ORAL | Status: AC

## 2014-04-26 ENCOUNTER — Other Ambulatory Visit: Payer: Self-pay | Admitting: Family Medicine

## 2014-04-26 NOTE — Progress Notes (Signed)
S:  This 54 y.o. Male has HTN and Type II DM, both well controlled. 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 Pt is compliant with medications and diet. He has no adverse medication effects. Today, he is troubled by the news that his girlfriend has liver cancer. He states her attitude is very positive and she is coping with the diagnosis better than he is; medications include a chronic anti-depressant. He has no thoughts of self-harm and has no resumed addictive behavior.  Pt needs medication refills anafter discussion of immunizations, [place] [employer] [facility]	[email or web address].  Patient Active Problem List   Diagnosis Date Noted  . HTN (hypertension) 07/21/2011  . Type II diabetes mellitus 07/21/2011  . Tobacco user 07/21/2011  . Late effects of CVA (cerebrovascular accident) 07/21/2011  . Hepatitis C 07/21/2011  . History of substance abuse 07/21/2011    Prior to Admission medications   Medication Sig Start Date End Date Taking? Authorizing Provider  cholecalciferol (VITAMIN D) 1000 UNITS tablet Take 1,000 Units by mouth daily.   Yes Historical Provider, MD  DULoxetine (CYMBALTA) 60 MG capsule Take 1 capsule (60 mg total) by mouth daily.   Yes Maurice MarchBarbara B Qamar Aughenbaugh, MD  lisinopril (PRINIVIL,ZESTRIL) 5 MG tablet Take 1 tablet (5 mg total) by mouth daily.   Yes Maurice MarchBarbara B Roselee Tayloe, MD  metFORMIN (GLUCOPHAGE) 500 MG tablet Take 1 tablet (500 mg total) by mouth daily.   Yes Maurice MarchBarbara B Jadynn Epping, MD  Potassium (POTASSIMIN PO) Take 1 tablet by mouth daily.   Yes Historical Provider, MD  simvastatin (ZOCOR) 80 MG tablet Take 1 tablet (80 mg total) by mouth daily.   Yes Maurice MarchBarbara B Jai Bear, MD  vitamin B-12 (CYANOCOBALAMIN) 1000 MCG tablet Take by mouth daily.   Yes Historical Provider, MD  [facility] & TAKE 1 DOSE EVERY MORNING . 01/20/14  Yes Maurice MarchBarbara B Laurelin Elson, MD  aspirin 81 MG tablet Take 81 mg by mouth daily.    Historical Provider, MD    History   Social  History  . Marital Status: Widowed    Spouse Name: N/A  . Number of Children: N/A  . Years of Education: N/A   Occupational History  . Not on file.   Social History Main Topics  . Smoking status: Current Every Day Smoker -- 0.50 packs/day for 40 years    Types: Cigarettes  . Smokeless tobacco: Not on file  . Alcohol Use: No  . Drug Use: No  . Sexual Activity: Not Currently   Other Topics Concern  . Not on file   Social History Narrative    Family History  Problem Relation Age of Onset  . Heart attack Father   . Cancer Paternal Grandmother   . COPD Mother     ROS: As per HPI. Negative for fatigue, abnormal weight loss, diaphoresis, CP or tightness, palpitations, edema, SOB or DOE, cough, abd pain, GI upset, myalgias, HA, dizziness, numbness or syncope.  O: Filed Vitals:   04/24/14 0948  BP: 101/72  Pulse: 97  Temp: 98.6 F (37 C)  Resp: 18    GEN: In NAD: WN,WD. HENT: [place]/AT; EOMI w/ clear conj/sclerae. Ext ears and nose normal and oral mucosa moist. NECK: Supple. COR: RRR. LUNGS: Unlabored resp. SKIN: W&D; intact w/o diaphoresis or pallor. NEURO: A&O x 3; CNs- mild dysarthria and RUE weakness and mild R-sided hemiparesis. Gait abnormal; ambulates w/ cane.  A/P: Essential hypertension - Stable on current medications. Plan: Basic metabolic panel,  Magnesium  Type 2 diabetes mellitus without complication - Plan: Ambulatory referral to Ophthalmology  Need for prophylactic vaccination against Streptococcus pneumoniae (pneumococcus) - Plan: Pneumococcal conjugate vaccine 13-valent IM   Meds ordered this encounter  Medications  . DULoxetine (CYMBALTA) 60 MG capsule    Sig: Take 1 capsule (60 mg total) by mouth daily.    Dispense:  90 capsule    Refill:  3  . lisinopril (PRINIVIL,ZESTRIL) 5 MG tablet    Sig: Take 1 tablet (5 mg total) by mouth daily.    Dispense:  90 tablet    Refill:  3  . metFORMIN (GLUCOPHAGE) 500 MG tablet    Sig: Take 1 tablet (500 mg  total) by mouth daily.    Dispense:  90 tablet    Refill:  3  . simvastatin (ZOCOR) 80 MG tablet    Sig: Take 1 tablet (80 mg total) by mouth daily.    Dispense:  90 tablet    Refill:  3

## 2014-04-28 NOTE — Progress Notes (Signed)
Quick Note:  Please notify pt that results are normal.   Provide pt with copy of labs. ______ 

## 2014-04-30 ENCOUNTER — Other Ambulatory Visit: Payer: Self-pay | Admitting: Family Medicine

## 2014-08-17 ENCOUNTER — Ambulatory Visit: Payer: Commercial Managed Care - HMO | Admitting: Family Medicine

## 2014-08-26 ENCOUNTER — Ambulatory Visit: Payer: Commercial Managed Care - HMO | Admitting: Family Medicine

## 2014-08-31 ENCOUNTER — Ambulatory Visit: Payer: Commercial Managed Care - HMO | Admitting: Family Medicine

## 2014-09-21 DEATH — deceased

## 2014-11-30 ENCOUNTER — Telehealth: Payer: Self-pay | Admitting: Family Medicine

## 2015-01-26 NOTE — Telephone Encounter (Signed)
ERROR
